# Patient Record
Sex: Female | Born: 1950 | Race: Black or African American | Hispanic: No | Marital: Single | Smoking: Never smoker
Health system: Southern US, Community
[De-identification: ages and names within clinical notes are randomized; demographics above are authoritative.]

## PROBLEM LIST (undated history)

## (undated) DIAGNOSIS — C801 Malignant (primary) neoplasm, unspecified: Secondary | ICD-10-CM

## (undated) DIAGNOSIS — I10 Essential (primary) hypertension: Secondary | ICD-10-CM

## (undated) HISTORY — PX: PORTACATH PLACEMENT: SHX2246

---

## 2001-10-21 ENCOUNTER — Encounter: Payer: Self-pay | Admitting: Emergency Medicine

## 2001-10-21 ENCOUNTER — Emergency Department (HOSPITAL_COMMUNITY): Admission: EM | Admit: 2001-10-21 | Discharge: 2001-10-21 | Payer: Self-pay | Admitting: Emergency Medicine

## 2003-12-05 ENCOUNTER — Emergency Department (HOSPITAL_COMMUNITY): Admission: EM | Admit: 2003-12-05 | Discharge: 2003-12-06 | Payer: Self-pay | Admitting: *Deleted

## 2004-12-21 ENCOUNTER — Emergency Department (HOSPITAL_COMMUNITY): Admission: EM | Admit: 2004-12-21 | Discharge: 2004-12-21 | Payer: Self-pay | Admitting: Emergency Medicine

## 2007-08-27 ENCOUNTER — Emergency Department (HOSPITAL_COMMUNITY): Admission: EM | Admit: 2007-08-27 | Discharge: 2007-08-27 | Payer: Self-pay | Admitting: Emergency Medicine

## 2009-06-26 ENCOUNTER — Emergency Department (HOSPITAL_COMMUNITY): Admission: EM | Admit: 2009-06-26 | Discharge: 2009-06-26 | Payer: Self-pay | Admitting: Emergency Medicine

## 2010-03-22 ENCOUNTER — Encounter (HOSPITAL_COMMUNITY): Admission: RE | Admit: 2010-03-22 | Discharge: 2010-03-22 | Payer: Self-pay | Admitting: Cardiology

## 2010-10-08 ENCOUNTER — Encounter: Payer: Self-pay | Admitting: Internal Medicine

## 2010-12-21 LAB — POCT I-STAT, CHEM 8
BUN: 15 mg/dL (ref 6–23)
Calcium, Ion: 1.16 mmol/L (ref 1.12–1.32)
Chloride: 106 mEq/L (ref 96–112)
Creatinine, Ser: 1 mg/dL (ref 0.4–1.2)
Glucose, Bld: 87 mg/dL (ref 70–99)
HCT: 34 % — ABNORMAL LOW (ref 36.0–46.0)
Hemoglobin: 11.6 g/dL — ABNORMAL LOW (ref 12.0–15.0)
Potassium: 3.6 mEq/L (ref 3.5–5.1)
Sodium: 140 mEq/L (ref 135–145)
TCO2: 26 mmol/L (ref 0–100)

## 2010-12-21 LAB — POCT CARDIAC MARKERS
CKMB, poc: <1 ng/mL — ABNORMAL LOW (ref 1.0–8.0)
Myoglobin, poc: 88.8 ng/mL (ref 12–200)
Troponin i, poc: <0.05 ng/mL (ref 0.00–0.09)

## 2011-06-25 LAB — URINALYSIS, ROUTINE W REFLEX MICROSCOPIC
Ketones, ur: NEGATIVE
Nitrite: NEGATIVE
Protein, ur: 100 — AB
Urobilinogen, UA: 0.2
pH: 7

## 2012-06-27 ENCOUNTER — Other Ambulatory Visit (HOSPITAL_COMMUNITY): Payer: Self-pay | Admitting: Internal Medicine

## 2012-06-27 DIAGNOSIS — Z139 Encounter for screening, unspecified: Secondary | ICD-10-CM

## 2012-07-03 ENCOUNTER — Inpatient Hospital Stay (HOSPITAL_COMMUNITY): Admission: RE | Admit: 2012-07-03 | Payer: Self-pay | Source: Ambulatory Visit

## 2012-07-22 ENCOUNTER — Telehealth: Payer: Self-pay

## 2012-07-22 NOTE — Telephone Encounter (Signed)
LMOM to call.

## 2012-07-29 NOTE — Telephone Encounter (Signed)
Called pt. She wants to check her schedule and call me back.

## 2012-08-01 NOTE — Telephone Encounter (Signed)
Letter to pt and PCP.  

## 2015-03-28 ENCOUNTER — Other Ambulatory Visit (HOSPITAL_COMMUNITY): Payer: Self-pay | Admitting: Internal Medicine

## 2015-03-28 DIAGNOSIS — Z1231 Encounter for screening mammogram for malignant neoplasm of breast: Secondary | ICD-10-CM

## 2015-04-11 ENCOUNTER — Ambulatory Visit (HOSPITAL_COMMUNITY)
Admission: RE | Admit: 2015-04-11 | Discharge: 2015-04-11 | Disposition: A | Discharge status: Home / Self Care | Payer: BC Managed Care – PPO | Source: Ambulatory Visit | Attending: Internal Medicine | Admitting: Internal Medicine

## 2015-04-11 DIAGNOSIS — Z1231 Encounter for screening mammogram for malignant neoplasm of breast: Secondary | ICD-10-CM | POA: Insufficient documentation

## 2015-04-29 ENCOUNTER — Encounter (HOSPITAL_COMMUNITY): Payer: Self-pay | Admitting: Emergency Medicine

## 2015-04-29 ENCOUNTER — Inpatient Hospital Stay (HOSPITAL_COMMUNITY)
Admission: EM | Admit: 2015-04-29 | Discharge: 2015-05-01 | DRG: 375 | Disposition: A | Discharge status: Home / Self Care | Payer: BC Managed Care – PPO | Attending: Internal Medicine | Admitting: Internal Medicine

## 2015-04-29 ENCOUNTER — Emergency Department (HOSPITAL_COMMUNITY): Payer: BC Managed Care – PPO

## 2015-04-29 DIAGNOSIS — R740 Nonspecific elevation of levels of transaminase and lactic acid dehydrogenase [LDH]: Secondary | ICD-10-CM

## 2015-04-29 DIAGNOSIS — I10 Essential (primary) hypertension: Secondary | ICD-10-CM | POA: Diagnosis present

## 2015-04-29 DIAGNOSIS — Z7982 Long term (current) use of aspirin: Secondary | ICD-10-CM | POA: Diagnosis not present

## 2015-04-29 DIAGNOSIS — R1084 Generalized abdominal pain: Secondary | ICD-10-CM

## 2015-04-29 DIAGNOSIS — C189 Malignant neoplasm of colon, unspecified: Principal | ICD-10-CM | POA: Diagnosis present

## 2015-04-29 DIAGNOSIS — R195 Other fecal abnormalities: Secondary | ICD-10-CM | POA: Diagnosis not present

## 2015-04-29 DIAGNOSIS — R7401 Elevation of levels of liver transaminase levels: Secondary | ICD-10-CM

## 2015-04-29 DIAGNOSIS — K625 Hemorrhage of anus and rectum: Secondary | ICD-10-CM

## 2015-04-29 DIAGNOSIS — C787 Secondary malignant neoplasm of liver and intrahepatic bile duct: Secondary | ICD-10-CM | POA: Diagnosis present

## 2015-04-29 DIAGNOSIS — K5660 Unspecified intestinal obstruction: Secondary | ICD-10-CM | POA: Diagnosis present

## 2015-04-29 DIAGNOSIS — R52 Pain, unspecified: Secondary | ICD-10-CM

## 2015-04-29 DIAGNOSIS — R112 Nausea with vomiting, unspecified: Secondary | ICD-10-CM | POA: Diagnosis present

## 2015-04-29 DIAGNOSIS — K6389 Other specified diseases of intestine: Secondary | ICD-10-CM

## 2015-04-29 DIAGNOSIS — R100 Acute abdomen: Secondary | ICD-10-CM | POA: Diagnosis not present

## 2015-04-29 DIAGNOSIS — R1110 Vomiting, unspecified: Secondary | ICD-10-CM

## 2015-04-29 DIAGNOSIS — K5900 Constipation, unspecified: Secondary | ICD-10-CM | POA: Diagnosis not present

## 2015-04-29 DIAGNOSIS — R109 Unspecified abdominal pain: Secondary | ICD-10-CM | POA: Diagnosis present

## 2015-04-29 DIAGNOSIS — R10811 Right upper quadrant abdominal tenderness: Secondary | ICD-10-CM | POA: Diagnosis present

## 2015-04-29 DIAGNOSIS — C801 Malignant (primary) neoplasm, unspecified: Secondary | ICD-10-CM | POA: Diagnosis not present

## 2015-04-29 HISTORY — DX: Essential (primary) hypertension: I10

## 2015-04-29 LAB — BASIC METABOLIC PANEL
Anion gap: 11 (ref 5–15)
BUN: 18 mg/dL (ref 6–20)
CHLORIDE: 100 mmol/L — AB (ref 101–111)
CO2: 24 mmol/L (ref 22–32)
CREATININE: 1.13 mg/dL — AB (ref 0.44–1.00)
Calcium: 9.2 mg/dL (ref 8.9–10.3)
GFR calc non Af Amer: 50 mL/min — ABNORMAL LOW (ref >60)
GFR, EST AFRICAN AMERICAN: 58 mL/min — AB (ref >60)
GLUCOSE: 123 mg/dL — AB (ref 65–99)
POTASSIUM: 3.8 mmol/L (ref 3.5–5.1)
Sodium: 135 mmol/L (ref 135–145)

## 2015-04-29 LAB — HEPATIC FUNCTION PANEL
ALT: 106 U/L — AB (ref 14–54)
AST: 69 U/L — ABNORMAL HIGH (ref 15–41)
Albumin: 3.5 g/dL (ref 3.5–5.0)
Alkaline Phosphatase: 356 U/L — ABNORMAL HIGH (ref 38–126)
Bilirubin, Direct: 0.2 mg/dL (ref 0.1–0.5)
Indirect Bilirubin: 0.5 mg/dL (ref 0.3–0.9)
TOTAL PROTEIN: 7.4 g/dL (ref 6.5–8.1)
Total Bilirubin: 0.7 mg/dL (ref 0.3–1.2)

## 2015-04-29 LAB — CBC
HEMATOCRIT: 30.4 % — AB (ref 36.0–46.0)
Hemoglobin: 9.9 g/dL — ABNORMAL LOW (ref 12.0–15.0)
MCH: 28.7 pg (ref 26.0–34.0)
MCHC: 32.6 g/dL (ref 30.0–36.0)
MCV: 88.1 fL (ref 78.0–100.0)
Platelets: 285 10*3/uL (ref 150–400)
RBC: 3.45 MIL/uL — AB (ref 3.87–5.11)
RDW: 14.3 % (ref 11.5–15.5)
WBC: 9.6 10*3/uL (ref 4.0–10.5)

## 2015-04-29 LAB — TROPONIN I

## 2015-04-29 LAB — LIPASE, BLOOD: Lipase: 32 U/L (ref 22–51)

## 2015-04-29 LAB — I-STAT CG4 LACTIC ACID, ED: Lactic Acid, Venous: 0.62 mmol/L (ref 0.5–2.0)

## 2015-04-29 LAB — POC OCCULT BLOOD, ED: FECAL OCCULT BLD: POSITIVE — AB

## 2015-04-29 MED ORDER — ONDANSETRON HCL 4 MG/2ML IJ SOLN
INTRAMUSCULAR | Status: AC
  Filled 2015-04-29: qty 2

## 2015-04-29 MED ORDER — ONDANSETRON HCL 4 MG/2ML IJ SOLN
4.0000 mg | Freq: Once | INTRAMUSCULAR | Status: DC
  Filled 2015-04-29: qty 2

## 2015-04-29 MED ORDER — ONDANSETRON HCL 4 MG/2ML IJ SOLN
4.0000 mg | Freq: Once | INTRAMUSCULAR | Status: AC
  Administered 2015-04-29: 4 mg via INTRAVENOUS

## 2015-04-29 MED ORDER — SODIUM CHLORIDE 0.9 % IV BOLUS (SEPSIS)
500.0000 mL | Freq: Once | INTRAVENOUS | Status: AC
  Administered 2015-04-29: 500 mL via INTRAVENOUS

## 2015-04-29 MED ORDER — SODIUM CHLORIDE 0.9 % IV SOLN
INTRAVENOUS | Status: DC
  Administered 2015-04-30: via INTRAVENOUS

## 2015-04-29 NOTE — ED Provider Notes (Signed)
CSN: [id number]     Arrival date & time 04/29/15  2049 History  This chart was scribed for Daleen Bo, MD by Irene Pap, ED Scribe. This patient was seen in room APA18/APA18 and patient care was started at 9:18 PM.    Chief Complaint  Patient presents with  . Chest Pain   The history is provided by the patient. No language interpreter was used.  HPI Comments: Katie Moss is a 64 y.o. Female with a hx of HTN who presents to the Emergency Department complaining of waxing and waning chest pain onset earlier today. Pt states that the pain first started in her abdomen for two days and radiated up to her chest. She currently rates the pain 8/10. Reports associated bright red hematochezia, 1 episode at 6 PM, nausea that started today, weakness with standing, non-productive cough, and constipation; states that she was straining today when she saw the blood. Pt states that she has not eaten today and experiences nausea with drinking water. She states that she was seen by her PCP two weeks ago and had a recent change in medication for her HTN, a generic form of Diovan. She states that her kidneys have been acting up and is being monitored by her PCP, believes this may be contributing to her symptoms. Pt denies fever, vomiting, SOB, or diarrhea.   Past Medical History  Diagnosis Date  . Hypertension    History reviewed. No pertinent past surgical history. History reviewed. No pertinent family history. Social History  Substance Use Topics  . Smoking status: Never Smoker   . Smokeless tobacco: None  . Alcohol Use: No   OB History    No data available     Review of Systems  Constitutional: Positive for appetite change. Negative for fever.  Respiratory: Positive for cough. Negative for shortness of breath.   Cardiovascular: Positive for chest pain.  Gastrointestinal: Positive for nausea, abdominal pain, constipation and blood in stool. Negative for vomiting and diarrhea.   Neurological: Positive for weakness.  All other systems reviewed and are negative.  Allergies  Review of patient's allergies indicates no known allergies.  Home Medications   Prior to Admission medications   Medication Sig Start Date End Date Taking? Authorizing Provider  aspirin EC 81 MG tablet Take 81 mg by mouth daily.   Yes Historical Provider, MD  atorvastatin (LIPITOR) 20 MG tablet Take 20 mg by mouth daily.   Yes Historical Provider, MD  valsartan (DIOVAN) 320 MG tablet Take 320 mg by mouth daily.   Yes Historical Provider, MD  VITAMIN E COMPLEX PO Take 1 capsule by mouth daily.   Yes Historical Provider, MD   BP 165/86 mmHg  Pulse 82  Temp(Src) 98.8 F (37.1 C) (Oral)  Resp 21  Ht 5\' 7"  (1.702 m)  Wt 173 lb (78.472 kg)  BMI 27.09 kg/m2  SpO2 98%  Physical Exam  Constitutional: She is oriented to person, place, and time. She appears well-developed and well-nourished.  HENT:  Head: Normocephalic and atraumatic.  Eyes: Conjunctivae and EOM are normal. Pupils are equal, round, and reactive to light.  Neck: Normal range of motion and phonation normal. Neck supple.  Cardiovascular: Normal rate and regular rhythm.   Pulmonary/Chest: Effort normal and breath sounds normal. She exhibits no tenderness.  Abdominal: Soft. She exhibits no distension. There is tenderness. There is no guarding.  Diffuse abdominal tenderness, worse in the RUQ and LUQ  Genitourinary:  Anus; has external skin tags. Posterior aspect of  2 of the skin tags have superficial fissuring, consistent with recent site of visualized red blood. Stool is black and slightly tarry. Stool guaiac is positive. No fecal impaction or mass.  Musculoskeletal: Normal range of motion.  Neurological: She is alert and oriented to person, place, and time. She exhibits normal muscle tone.  Skin: Skin is warm and dry.  Psychiatric: She has a normal mood and affect. Her behavior is normal. Judgment and thought content normal.   Nursing note and vitals reviewed.   ED Course  Procedures (including critical care time) DIAGNOSTIC STUDIES: Oxygen Saturation is 98% on RA, normal by my interpretation.    COORDINATION OF CARE: 9:23 PM-Discussed treatment plan which includes rectal exam and labs with pt at bedside and pt agreed to plan.    Patient Vitals for the past 24 hrs:  BP Temp Temp src Pulse Resp SpO2 Height Weight  04/29/15 2217 152/86 mmHg - - 69 24 100 % - -  04/29/15 2130 150/87 mmHg - - - - - - -  04/29/15 2115 - - - 71 20 98 % - -  04/29/15 2100 165/86 mmHg 98.8 F (37.1 C) Oral 82 21 98 % 5\' 7"  (1.702 m) 173 lb (78.472 kg)    10:42 PM Reevaluation with update and discussion. After initial assessment and treatment, an updated evaluation reveals pt exam is unchanged. Brinton Brandel L   11:30 PM-Consult complete with Dr. Shanon Brow. Patient case explained and discussed. She agrees to admit patient for further evaluation and treatment. Call ended at [place] - Abnormal; Notable for the following:    Chloride 100 (*)    Glucose, Bld 123 (*)    Creatinine, Ser 1.13 (*)    GFR calc non Af Amer 50 (*)    GFR calc Af Amer 58 (*)    All other components within normal limits  CBC - Abnormal; Notable for the following:    RBC 3.45 (*)    Hemoglobin 9.9 (*)    HCT 30.4 (*)    All other components within normal limits  HEPATIC FUNCTION PANEL - Abnormal; Notable for the following:    AST 69 (*)    ALT 106 (*)    Alkaline Phosphatase 356 (*)    All other components within normal limits  POC OCCULT BLOOD, ED - Abnormal; Notable for the following:    Fecal Occult Bld POSITIVE (*)    All other components within normal limits  TROPONIN I  LIPASE, BLOOD  I-STAT CG4 LACTIC ACID, ED  I-STAT CG4 LACTIC ACID, ED   Filed Vitals:   04/29/15 2217  BP: 152/86  Pulse: 69  Temp:   Resp: 24   Medications  0.9 %  sodium chloride infusion (not administered)   ondansetron (ZOFRAN) injection 4 mg ( Intravenous Not Given 04/29/15 2145)  sodium chloride 0.9 % bolus 500 mL (500 mLs Intravenous New Bag/Given 04/29/15 2302)    Imaging Review Dg Chest 2 View  04/29/2015   CLINICAL DATA:  Intermittent chest pain and nausea.  EXAM: CHEST - 2 VIEW  COMPARISON:  Stable the one-view chest radiograph 12/05/2003.  FINDINGS: Heart size is normal. Lungs are clear. Mild rightward curvature is present in the upper thoracic spine. There is no edema or effusion to suggest failure. Degenerative changes are present in the shoulders, right greater than left. There is widening of the [facility] joint bilaterally.  IMPRESSION: 1. No acute cardiopulmonary disease. 2. Scoliosis of the  upper thoracic spine. 3. Degenerative changes in the shoulders.   Electronically Signed   By: San Morelle M.D.   On: 04/29/2015 21:56   Dg Abd 2 Views  04/29/2015   CLINICAL DATA:  Mid abdominal pain x2 days, chest pain, nausea, weakness  EXAM: ABDOMEN - 2 VIEW  COMPARISON:  None.  FINDINGS: Nonobstructive bowel gas pattern.  No evidence of free air under the diaphragm on the upright view.  Moderate right colonic stool burden.  IMPRESSION: No evidence of small bowel obstruction or free air.  Moderate right colonic stool burden.   Electronically Signed   By: Julian Hy M.D.   On: 04/29/2015 21:56      EKG Interpretation   Date/Time:  Friday April 29 2015 22:42:17 EDT Ventricular Rate:  69 PR Interval:  184 QRS Duration: 92 QT Interval:  406 QTC Calculation: 435 R Axis:   46 Text Interpretation:  Sinus rhythm Consider left atrial enlargement Since  last tracing of earlier today No significant change was found Confirmed by  Eulis Foster  MD, Kamil Mchaffie [phone]) on 04/29/2015 10:46:18 PM        EKG Interpretation  Date/Time:  Friday April 29 2015 22:42:17 EDT Ventricular Rate:  69 PR Interval:  184 QRS Duration: 92 QT Interval:  406 QTC Calculation: 435 R Axis:   46 Text  Interpretation:  Sinus rhythm Consider left atrial enlargement Since last tracing of earlier today No significant change was found Confirmed by Eulis Foster  MD, Aerilyn Slee ([id number]) on 04/29/2015 10:46:18 PM         MDM   Final diagnoses:  Vomiting  Generalized abdominal pain  Rectal bleeding  Constipation, unspecified constipation type  Transaminitis   Nonspecific abdominal pain with vomiting and chest discomfort. Patient with ongoing constipation, and rectal bleeding after straining to have a bowel movement, tonight. No fecal impaction, or bowel obstruction. Moderate colonic stool burden. Doubt upper GI bleed. Stool dark in color, indicating higher source of bleeding. Lactate is normal, making bowel ischemia less likely. Possible small bowel bleeding. Nonspecific transaminitis with normal bilirubin. Doubt pancreatitis, ACS, serious bacterial infection or metabolic instability.  Nursing Notes Reviewed/ Care Coordinated, and agree without changes. Applicable Imaging Reviewed.  Interpretation of Laboratory Data incorporated into ED treatment  Plan: Admit   I personally performed the services described in this documentation, which was scribed in my presence. The recorded information has been reviewed and is accurate.      Daleen Bo, MD 04/29/15 [phone]

## 2015-04-29 NOTE — ED Notes (Addendum)
Patient complaining of chest pain off and on since awakening this morning. States it started as abdominal pain and radiates into chest pain. Patient vomiting at triage.

## 2015-04-30 ENCOUNTER — Inpatient Hospital Stay (HOSPITAL_COMMUNITY): Payer: BC Managed Care – PPO

## 2015-04-30 DIAGNOSIS — K5900 Constipation, unspecified: Secondary | ICD-10-CM | POA: Insufficient documentation

## 2015-04-30 DIAGNOSIS — R112 Nausea with vomiting, unspecified: Secondary | ICD-10-CM

## 2015-04-30 DIAGNOSIS — R100 Acute abdomen: Secondary | ICD-10-CM

## 2015-04-30 DIAGNOSIS — R195 Other fecal abnormalities: Secondary | ICD-10-CM

## 2015-04-30 LAB — COMPREHENSIVE METABOLIC PANEL
ALBUMIN: 3.3 g/dL — AB (ref 3.5–5.0)
ALT: 95 U/L — AB (ref 14–54)
AST: 59 U/L — AB (ref 15–41)
Alkaline Phosphatase: 342 U/L — ABNORMAL HIGH (ref 38–126)
Anion gap: 10 (ref 5–15)
BILIRUBIN TOTAL: 0.6 mg/dL (ref 0.3–1.2)
BUN: 17 mg/dL (ref 6–20)
CHLORIDE: 104 mmol/L (ref 101–111)
CO2: 23 mmol/L (ref 22–32)
Calcium: 9 mg/dL (ref 8.9–10.3)
Creatinine, Ser: 1.11 mg/dL — ABNORMAL HIGH (ref 0.44–1.00)
GFR calc non Af Amer: 51 mL/min — ABNORMAL LOW (ref >60)
GFR, EST AFRICAN AMERICAN: 59 mL/min — AB (ref >60)
GLUCOSE: 128 mg/dL — AB (ref 65–99)
Potassium: 3.5 mmol/L (ref 3.5–5.1)
Sodium: 137 mmol/L (ref 135–145)
TOTAL PROTEIN: 7.3 g/dL (ref 6.5–8.1)

## 2015-04-30 LAB — CBC
HCT: 31.2 % — ABNORMAL LOW (ref 36.0–46.0)
Hemoglobin: 10.1 g/dL — ABNORMAL LOW (ref 12.0–15.0)
MCH: 28.5 pg (ref 26.0–34.0)
MCHC: 32.4 g/dL (ref 30.0–36.0)
MCV: 88.1 fL (ref 78.0–100.0)
PLATELETS: 353 10*3/uL (ref 150–400)
RBC: 3.54 MIL/uL — AB (ref 3.87–5.11)
RDW: 14.2 % (ref 11.5–15.5)
WBC: 10.4 10*3/uL (ref 4.0–10.5)

## 2015-04-30 MED ORDER — POLYETHYLENE GLYCOL 3350 17 G PO PACK
17.0000 g | PACK | Freq: Every day | ORAL | Status: DC
  Administered 2015-04-30: 17 g via ORAL
  Filled 2015-04-30 (×2): qty 1

## 2015-04-30 MED ORDER — HYDROMORPHONE HCL 1 MG/ML IJ SOLN: 1.0000 mg | INTRAMUSCULAR | Status: DC | PRN

## 2015-04-30 MED ORDER — SODIUM CHLORIDE 0.9 % IJ SOLN
INTRAMUSCULAR | Status: AC
  Filled 2015-04-30: qty 500

## 2015-04-30 MED ORDER — IOHEXOL 300 MG/ML  SOLN
50.0000 mL | Freq: Once | INTRAMUSCULAR | Status: AC | PRN
Start: 2015-04-30 — End: 2015-04-30
  Administered 2015-04-30: 50 mL via ORAL

## 2015-04-30 MED ORDER — PROMETHAZINE HCL 25 MG/ML IJ SOLN
12.5000 mg | Freq: Four times a day (QID) | INTRAMUSCULAR | Status: DC | PRN
  Administered 2015-04-30: 12.5 mg via INTRAVENOUS
  Filled 2015-04-30: qty 1

## 2015-04-30 MED ORDER — IRBESARTAN 75 MG PO TABS
37.5000 mg | ORAL_TABLET | Freq: Every day | ORAL | Status: DC
  Filled 2015-04-30 (×2): qty 1

## 2015-04-30 MED ORDER — ONDANSETRON HCL 4 MG/2ML IJ SOLN
4.0000 mg | Freq: Four times a day (QID) | INTRAMUSCULAR | Status: DC | PRN
  Administered 2015-04-30 – 2015-05-01 (×3): 4 mg via INTRAVENOUS
  Filled 2015-04-30 (×3): qty 2

## 2015-04-30 MED ORDER — SODIUM CHLORIDE 0.9 % IJ SOLN
INTRAMUSCULAR | Status: AC
  Filled 2015-04-30: qty 30

## 2015-04-30 MED ORDER — BISACODYL 10 MG RE SUPP: 10.0000 mg | Freq: Every day | RECTAL | Status: DC | PRN

## 2015-04-30 MED ORDER — SODIUM CHLORIDE 0.9 % IV SOLN
INTRAVENOUS | Status: DC
  Administered 2015-04-30: 21:00:00 via INTRAVENOUS

## 2015-04-30 MED ORDER — ALUM & MAG HYDROXIDE-SIMETH 200-200-20 MG/5ML PO SUSP: 30.0000 mL | Freq: Four times a day (QID) | ORAL | Status: DC | PRN

## 2015-04-30 MED ORDER — ASPIRIN EC 81 MG PO TBEC
81.0000 mg | DELAYED_RELEASE_TABLET | Freq: Every day | ORAL | Status: DC
  Administered 2015-04-30: 81 mg via ORAL
  Filled 2015-04-30 (×2): qty 1

## 2015-04-30 MED ORDER — ATORVASTATIN CALCIUM 20 MG PO TABS
20.0000 mg | ORAL_TABLET | Freq: Every day | ORAL | Status: DC
  Administered 2015-04-30: 20 mg via ORAL
  Filled 2015-04-30 (×2): qty 1

## 2015-04-30 MED ORDER — PROMETHAZINE HCL 12.5 MG PO TABS: 12.5000 mg | ORAL_TABLET | Freq: Four times a day (QID) | ORAL | Status: DC | PRN

## 2015-04-30 MED ORDER — SODIUM CHLORIDE 0.9 % IV SOLN
INTRAVENOUS | Status: DC
  Administered 2015-04-30 (×2): via INTRAVENOUS

## 2015-04-30 MED ORDER — SODIUM CHLORIDE 0.9 % IV SOLN
INTRAVENOUS | Status: DC
Start: 2015-04-30 — End: 2015-04-30

## 2015-04-30 MED ORDER — IOHEXOL 300 MG/ML  SOLN
100.0000 mL | Freq: Once | INTRAMUSCULAR | Status: AC | PRN
Start: 2015-04-30 — End: 2015-04-30
  Administered 2015-04-30: 100 mL via INTRAVENOUS

## 2015-04-30 NOTE — Progress Notes (Addendum)
Called to bedside given abnormal CT abd results. Please see CT findings. Essentially, findings are worrisome for metastatic colon cancer. Pt seen at bedside and informed of abnormal results. Pt actively vomiting. Will make NPO with basal IVF. Have discussed case with General Surgery who will see in consultation. Have also ordered CEA per Surgery recs. Will notify primary day team.

## 2015-04-30 NOTE — H&P (Signed)
PCP:   Glo Herring., MD   Chief Complaint:  Abdominal pain  HPI: 64 yo female comes in with nonbloody vomit and generalized abdominal pain for a day.  Denies fevers.  She says she has been very constipated lately and strained today and had some blood in her BM.  Very poor historian.  She has never been this constipated before.  On rectal exam with ED had melena and heme positive.  Referred for admission for her heme pos stool and constipation.  Review of Systems:  Positive and negative as per HPI otherwise all other systems are negative  Past Medical History: Past Medical History  Diagnosis Date  . Hypertension    History reviewed. No pertinent past surgical history.  Medications: Prior to Admission medications   Medication Sig Start Date End Date Taking? Authorizing Provider  aspirin EC 81 MG tablet Take 81 mg by mouth daily.   Yes Historical Provider, MD  atorvastatin (LIPITOR) 20 MG tablet Take 20 mg by mouth daily.   Yes Historical Provider, MD  valsartan (DIOVAN) 320 MG tablet Take 320 mg by mouth daily.   Yes Historical Provider, MD  VITAMIN E COMPLEX PO Take 1 capsule by mouth daily.   Yes Historical Provider, MD    Allergies:  No Known Allergies  Social History:  reports that she has never smoked. She does not have any smokeless tobacco history on file. She reports that she does not drink alcohol or use illicit drugs.  Family History: History reviewed. No pertinent family history.  Physical Exam: Filed Vitals:   04/29/15 2230 04/29/15 2245 04/29/15 2300 04/30/15 0057  BP: 152/84  146/85 151/82  Pulse:  74  74  Temp:    99 F (37.2 C)  TempSrc:      Resp:  _0 Height:    _1  (1.702 m)  Weight:    78.2 kg (172 lb 6.4 oz)  SpO2:  99%     General appearance: alert, cooperative and no distress Head: Normocephalic, without obvious abnormality, atraumatic Eyes: negative Nose: Nares normal. Septum midline. Mucosa normal. No drainage or sinus  tenderness. Neck: no JVD and supple, symmetrical, trachea midline Lungs: clear to auscultation bilaterally Heart: regular rate and rhythm, S1, S2 normal, no murmur, click, rub or gallop Abdomen: soft, non-tender; bowel sounds normal; no masses,  no organomegaly Extremities: extremities normal, atraumatic, no cyanosis or edema Pulses: 2+ and symmetric Skin: Skin color, texture, turgor normal. No rashes or lesions Neurologic: Grossly normal    Labs on Admission:   Recent Labs  04/29/15 2105  NA 135  K 3.8  CL 100*  CO2 24  GLUCOSE 123*  BUN 18  CREATININE 1.13*  CALCIUM 9.2    Recent Labs  04/29/15 2105  AST 69*  ALT 106*  ALKPHOS 356*  BILITOT 0.7  PROT 7.4  ALBUMIN 3.5    Recent Labs  04/29/15 2105  LIPASE 32    Recent Labs  04/29/15 2105  WBC 9.6  HGB 9.9*  HCT 30.4*  MCV 88.1  PLT 285    Recent Labs  04/29/15 2105  TROPONINI <0.03   Radiological Exams on Admission: Dg Chest 2 View  04/29/2015   CLINICAL DATA:  Intermittent chest pain and nausea.  EXAM: CHEST - 2 VIEW  COMPARISON:  Stable the one-view chest radiograph 12/05/2003.  FINDINGS: Heart size is normal. Lungs are clear. Mild rightward curvature is present in the upper thoracic spine. There is no edema or effusion  to suggest failure. Degenerative changes are present in the shoulders, right greater than left. There is widening of the [facility] joint bilaterally.  IMPRESSION: 1. No acute cardiopulmonary disease. 2. Scoliosis of the upper thoracic spine. 3. Degenerative changes in the shoulders.   Electronically Signed   By: San Morelle M.D.   On: 04/29/2015 21:56   Dg Abd 2 Views  04/29/2015   CLINICAL DATA:  Mid abdominal pain x2 days, chest pain, nausea, weakness  EXAM: ABDOMEN - 2 VIEW  COMPARISON:  None.  FINDINGS: Nonobstructive bowel gas pattern.  No evidence of free air under the diaphragm on the upright view.  Moderate right colonic stool burden.  IMPRESSION: No evidence of small bowel  obstruction or free air.  Moderate right colonic stool burden.   Electronically Signed   By: Julian Hy M.D.   On: 04/29/2015 21:56   Assessment/Plan  64 yo female with generalized abdominal pain  Principal Problem:   Abdominal pain, acute-  Hard to tell history from patient, lfts are elevated along with alk phos.  Will check abdominal US.  Treat constipation and monitor.  Ivf.  Zofran.    Pt has never had scoping so will need GI follow up as some point.  abd exam is benign.  Active Problems:   Constipation-  suppository   Heme positive stool-  Repeat h/h in am, will need c scope at some point has had no screening   Nausea & vomiting-  Resolved at this time   Hypertension-  noted  obs on medical.    DAVID,RACHAL A 04/30/2015, 2:20 AM

## 2015-04-30 NOTE — Progress Notes (Signed)
[facility] PROGRESS NOTE  Katie Moss ENI:[id number] DOB: [birth year] DOA: 04/29/2015 PCP: Glo Herring., MD  Assessment/Plan:  Acute abdominal pain -Suspect related to constipation. -DG Abd unremarkable -Abd [place]: IMPRESSION: 1. Combination of coarse liver echotexture and evidence of hepatic few will flow in the main portal vein suggest chronic hepatocellular disease. There are 2 large and septated but otherwise benign appearing cysts in the liver, 6.9 and 7.6 cm. 2. Abnormal soft tissue near the head of the pancreas and porta hepatis suspicious for upper abdominal lymphadenopathy, up to 5.4 cm. 3. Recommend followup CT Abdomen and Pelvis with oral and IV Contrast. -Will order CT scan for follow up as recommended by radiology.  Constipation -Start miralax. -abd xray with significant stool burden  Heme positive stool -Precipitated by constipation and straining with likely an anal fissure. -Bowel regimen ordered. -Will need f/u with GI as an OP for colonoscopy.  Nausea and Vomiting -Resolved with IVF and Zofran. Advance diet as tolerated.   Hypertension -Well controlled.   Transamnitits  -Follow up with PCP to discuss possible side effects of Lipitor.    Code Status: Full DVT prophylaxis:  SCDs Family Communication: Cousin at bedside, discussed care plan with both. They had no concerns at this time.  Disposition Plan: Discharge home in 24 hours  Consultants:  none  Procedures:  none  Antibiotics:  none   HPI/Subjective: Has been constipated for 3 days and last night noticed blood in stool. Abdominal pain and nausea is improving. No complaints of vomiting, chest pain, or headache.   Objective: Filed Vitals:   04/30/15 0647  BP: 135/73  Pulse: 65  Temp: 98.5 F (36.9 C)  Resp: 20   No intake or output data in the 24 hours ending 04/30/15 0657 Filed Weights   04/29/15 2100 04/30/15 0057 04/30/15 0500  Weight: 78.472 kg (173 lb) 78.2  kg (172 lb 6.4 oz) 78.2 kg (172 lb 6.4 oz)    Exam: General:  NAD, appears calm and comfortable, lying in bed, afebrile Cardiovascular: RRR, no m/r/g Respiratory: CTAB, no w/r/r Abdomen: soft, positive bowel sounds, no distension Extremities: no LE edema Neurologic:  Non focal  Data Reviewed: Basic Metabolic Panel:  Recent Labs Lab 04/29/15 2105  NA 135  K 3.8  CL 100*  CO2 24  GLUCOSE 123*  BUN 18  CREATININE 1.13*  CALCIUM 9.2   Liver Function Tests:  Recent Labs Lab 04/29/15 2105  AST 69*  ALT 106*  ALKPHOS 356*  BILITOT 0.7  PROT 7.4  ALBUMIN 3.5    Recent Labs Lab 04/29/15 2105  LIPASE 32   CBC:  Recent Labs Lab 04/29/15 2105 04/30/15 0542  WBC 9.6 10.4  HGB 9.9* 10.1*  HCT 30.4* 31.2*  MCV 88.1 88.1  PLT 285 353   Cardiac Enzymes:  Recent Labs Lab 04/29/15 2105  TROPONINI <0.03    Studies: Dg Chest 2 View  04/29/2015   CLINICAL DATA:  Intermittent chest pain and nausea.  EXAM: CHEST - 2 VIEW  COMPARISON:  Stable the one-view chest radiograph 12/05/2003.  FINDINGS: Heart size is normal. Lungs are clear. Mild rightward curvature is present in the upper thoracic spine. There is no edema or effusion to suggest failure. Degenerative changes are present in the shoulders, right greater than left. There is widening of the [facility] joint bilaterally.  IMPRESSION: 1. No acute cardiopulmonary disease. 2. Scoliosis of the upper thoracic spine. 3. Degenerative changes in the shoulders.   Electronically Signed   By: Harrell Gave  Mattern M.D.   On: 04/29/2015 21:56   Dg Abd 2 Views  04/29/2015   CLINICAL DATA:  Mid abdominal pain x2 days, chest pain, nausea, weakness  EXAM: ABDOMEN - 2 VIEW  COMPARISON:  None.  FINDINGS: Nonobstructive bowel gas pattern.  No evidence of free air under the diaphragm on the upright view.  Moderate right colonic stool burden.  IMPRESSION: No evidence of small bowel obstruction or free air.  Moderate right colonic stool burden.    Electronically Signed   By: Julian Hy M.D.   On: 04/29/2015 21:56    Scheduled Meds: . sodium chloride   Intravenous STAT  . aspirin EC  81 mg Oral Daily  . atorvastatin  20 mg Oral Daily   Continuous Infusions: . sodium chloride 75 mL/hr at 04/30/15 0346    Principal Problem:   Abdominal pain, acute Active Problems:   Constipation   Heme positive stool   Nausea & vomiting   Hypertension    Time spent: 35 minutes. Greater than 50% of this time was spent in direct contact with the patient coordinating care.   Domingo Mend, MD  [facility] Pager [phone]. If 7PM-7AM, please contact night-coverage at [email or web address], password [facility] 04/30/2015, 6:57 AM  LOS: 1 day     I, Jessica D. Leonie Green, acting as scribe, recorded this note contemporaneously in the presence of Dr. Lelon Frohlich, M.D. on 04/30/2015.     I have reviewed the above documentation for accuracy and completeness, and I agree with the above.  Domingo Mend, MD [facility] Pager: [phone]

## 2015-05-01 DIAGNOSIS — C787 Secondary malignant neoplasm of liver and intrahepatic bile duct: Secondary | ICD-10-CM

## 2015-05-01 DIAGNOSIS — K6389 Other specified diseases of intestine: Secondary | ICD-10-CM

## 2015-05-01 DIAGNOSIS — C801 Malignant (primary) neoplasm, unspecified: Secondary | ICD-10-CM

## 2015-05-01 MED ORDER — ONDANSETRON 8 MG PO TBDP: 8.0000 mg | ORAL_TABLET | Freq: Three times a day (TID) | ORAL | Status: DC | PRN

## 2015-05-01 NOTE — Progress Notes (Signed)
Patient discharged home today.  Patient was given discharge instructions.  Patient verbalized understanding with no complaints or concerns voiced at this time.  Patient's heart monitor was removed and central tele was notified of patient's discharge.  IV was removed with catheter intact, no bleeding or complications.  Patient left unit in stable condition by a staff member in a wheelchair.

## 2015-05-01 NOTE — Discharge Summary (Signed)
Physician Discharge Summary  Katie Moss TIR:[id number] DOB: 1950/11/15 DOA: 04/29/2015  PCP: Glo Herring., MD  Admit date: 04/29/2015 Discharge date: 05/01/2015  Time spent: 45 minutes  Recommendations for Outpatient Follow-up:  -Patient will be discharged today at her and her family's insistence. They believe that she would be better cared for at [facility] and plan on driving her there directly today after discharge. -Have advised patient and family that she needs medical care at present and have encouraged them to take her directly to the medical facility of their choice. -Appears to have a new diagnosis of metastatic colon cancer and will need follow-up with GI, surgery and subsequently oncology.    Discharge Diagnoses:  Principal Problem:   Abdominal pain, acute Active Problems:   Constipation   Heme positive stool   Nausea & vomiting   Hypertension   Colonic mass   Hepatic metastases   Discharge Condition: Stable, fair  Filed Weights   04/30/15 0057 04/30/15 0500 05/01/15 0543  Weight: 78.2 kg (172 lb 6.4 oz) 78.2 kg (172 lb 6.4 oz) 78.5 kg (173 lb 1 oz)    History of present illness:  As per H and P by Dr. Shanon Brow on 04/30/15: 64 yo female comes in with nonbloody vomit and generalized abdominal pain for a day. Denies fevers. She says she has been very constipated lately and strained today and had some blood in her BM. Very poor historian. She has never been this constipated before. On rectal exam with ED had melena and heme positive. Referred for admission for her heme pos stool and constipation.   Hospital Course:   Colonic mass with hepatic metastases -This is likely the etiology of her nausea, constipation and abdominal pain. -Have advised that she will need colonoscopy for biopsy purposes and possibly immediate surgery for what appears to be an obstructing colonic mass in the descending colon with distention of the right and  transverse colon, beyond the lesion the colon is small caliber and collapsed. -She does not appear to have complete colonic obstruction as she continues to pass flatus and has had a bowel movement. She is tolerating clear liquids without issue. -I had requested GI and surgery to see patient today, however as I come into the room to discuss CT scan findings and plan with patient and her family, they are adamant that they want her discharged so that they can pursue care at their hospital of choice which is [facility].  Apparently they have had a family member treated there in the past for colon cancer and have complete trust in their care.  Transaminitis - in light of CT scan findings, hepatic metastases are likely the etiology. However, never committed she stop use of Lipitor at this point.   Procedures:  None  Consultations:  None  Discharge Instructions      Discharge Instructions    Increase activity slowly    Complete by:  As directed             Medication List    STOP taking these medications        atorvastatin 20 MG tablet  Commonly known as:  LIPITOR      TAKE these medications        aspirin EC 81 MG tablet  Take 81 mg by mouth daily.     valsartan 320 MG tablet  Commonly known as:  DIOVAN  Take 320 mg by mouth daily.     VITAMIN E  COMPLEX PO  Take 1 capsule by mouth daily.       No Known Allergies    The results of significant diagnostics from this hospitalization (including imaging, microbiology, ancillary and laboratory) are listed below for reference.    Significant Diagnostic Studies: Dg Chest 2 View  04/29/2015   CLINICAL DATA:  Intermittent chest pain and nausea.  EXAM: CHEST - 2 VIEW  COMPARISON:  Stable the one-view chest radiograph 12/05/2003.  FINDINGS: Heart size is normal. Lungs are clear. Mild rightward curvature is present in the upper thoracic spine. There is no edema or effusion to suggest failure. Degenerative changes are present  in the shoulders, right greater than left. There is widening of the [facility] joint bilaterally.  IMPRESSION: 1. No acute cardiopulmonary disease. 2. Scoliosis of the upper thoracic spine. 3. Degenerative changes in the shoulders.   Electronically Signed   By: San Morelle M.D.   On: 04/29/2015 21:56   US Abdomen Complete  04/30/2015   CLINICAL DATA:  64 year old female with acute abdominal pain with vomiting for 4 days. Pain maximal in the right upper quadrant. Initial encounter.  EXAM: ULTRASOUND ABDOMEN COMPLETE  COMPARISON:  Abdomen radiographs 04/29/2015  FINDINGS: Gallbladder: No gallstones or wall thickening visualized. No sonographic Murphy sign noted.  Common bile duct: Diameter: 4 mm, normal  Liver: Heterogeneous and coarse echotexture (image 39). Large mildly septated cyst in the left lobe near the gallbladder fossa measuring 6.9 x 5.4 x 6.4 cm. There is a second septated cyst, subcapsular in location along the more posterior aspect of the liver measuring 7.6 x 4.5 x 7.1 cm. No intrahepatic biliary ductal dilatation.  IVC: No abnormality visualized.  Pancreas: Pancreatic parenchyma appears within normal limits. However, there is lobulated increased soft tissue near the pancreatic head and porta hepatis best seen on image 91 encompassing 54 x 23 mm. This might be lymphadenopathy.  Spleen: Size and appearance within normal limits.  Right Kidney: Length: 10.4 cm small septated but otherwise benign appearing lower pole cyst, 1.6 cm. Cortical echogenicity within normal limits. No hydronephrosis or other right renal lesion.  Left Kidney: Length: 10.1 cm. Possible 12 mm lower pole cyst. Echogenicity within normal limits. No mass or hydronephrosis visualized.  Abdominal aorta: No aneurysm visualized.  Other findings: Evidence of hepatofugal flow in the main portal vein (image 77).  IMPRESSION: 1. Combination of coarse liver echotexture and evidence of hepatic few will flow in the main portal vein suggest  chronic hepatocellular disease. There are 2 large and septated but otherwise benign appearing cysts in the liver, 6.9 and 7.6 cm. 2. Abnormal soft tissue near the head of the pancreas and porta hepatis suspicious for upper abdominal lymphadenopathy, up to 5.4 cm. 3. Recommend followup CT Abdomen and Pelvis with oral and IV contrast.   Electronically Signed   By: Genevie Ann M.D.   On: 04/30/2015 15:13   Ct Abdomen Pelvis W Contrast  04/30/2015   CLINICAL DATA:  Abdominal pain  EXAM: CT ABDOMEN AND PELVIS WITH CONTRAST  TECHNIQUE: Multidetector CT imaging of the abdomen and pelvis was performed using the standard protocol following bolus administration of intravenous contrast.  CONTRAST:  68mL OMNIPAQUE IOHEXOL 300 MG/ML SOLN, 159mL OMNIPAQUE IOHEXOL 300 MG/ML SOLN  COMPARISON:  Ultrasound same day  FINDINGS: Lung bases are unremarkable. Sagittal images of the spine shows degenerative changes thoracolumbar spine. Multiple cystic lesions are noted within liver. There is infiltrative lesion in inferior medial aspect of the right hepatic lobe measures 9.1 cm.  Irregular lesion in left hepatic lobe measures 2.4 cm. The pancreas shows normal enhancement. A periportal pathologic lymph node measures 1.8 cm. A portal caval pathologic lymph node measures 2.2 cm. No aortic aneurysm. Kidneys are symmetrical in size and enhancement. Adrenal glands are unremarkable. There is distension of the right colon with liquid stool. Distension of the transverse colon with air-fluid level. Distension of splenic flexure of the colon. Axial image 62 there is a obstructive colonic mass in descending colon measures at least 4.8 cm. There is abrupt change in caliber of descending colon. Distal left colon diverticula are noted. Multiple sigmoid colon diverticula. No evidence of acute diverticulitis. There is a retroflexed uterus. A probable uterine fibroid measures 2.4 cm. Degenerative changes bilateral hip joints. Small amount of free fluid within  pelvis. The urinary bladder is under distended.  IMPRESSION: 1. There is dominant infiltrative lesion in inferior aspect of right hepatic lobe measures 9/1 cm highly suspicious for metastatic disease. Additional lesion is noted in left hepatic lobe. Scattered hepatic cysts are noted. 2. There is periportal and portacaval adenopathy highly suspicious for metastatic disease. 3. There is distension of the right colon transverse colon and splenic flexure of the colon. There is obstructing colonic mass in descending colon axial image 56 measures at least 4.8 cm highly suspicious for colon carcinoma. Beyond the lesion the colon is small caliber collapsed. Colonic diverticula are noted distal end left colon and sigmoid colon. There is no evidence of acute diverticulitis. No definite evidence of acute colonic obstruction. Probable at least partial colonic obstruction. 4. There is retroflexed uterus. A probable fibroid within uterus measures 2.4 cm. 5. Small amount of free fluid noted within posterior pelvis. 6. Degenerative changes bilateral hip joints.   Electronically Signed   By: Lahoma Crocker M.D.   On: 04/30/2015 19:27   Dg Abd 2 Views  04/29/2015   CLINICAL DATA:  Mid abdominal pain x2 days, chest pain, nausea, weakness  EXAM: ABDOMEN - 2 VIEW  COMPARISON:  None.  FINDINGS: Nonobstructive bowel gas pattern.  No evidence of free air under the diaphragm on the upright view.  Moderate right colonic stool burden.  IMPRESSION: No evidence of small bowel obstruction or free air.  Moderate right colonic stool burden.   Electronically Signed   By: Julian Hy M.D.   On: 04/29/2015 21:56   Mm Digital Screening Bilateral  04/13/2015   CLINICAL DATA:  Screening.  EXAM: DIGITAL SCREENING BILATERAL MAMMOGRAM WITH CAD  COMPARISON:  None.  ACR Breast Density Category b: There are scattered areas of fibroglandular density.  FINDINGS: There are no findings suspicious for malignancy. Images were processed with CAD.   IMPRESSION: No mammographic evidence of malignancy. A result letter of this screening mammogram will be mailed directly to the patient.  RECOMMENDATION: Screening mammogram in one year. (Code:SM-B-01Y)  BI-RADS CATEGORY  1: Negative.   Electronically Signed   By: Lillia Mountain M.D.   On: 04/13/2015 16:24    Microbiology: No results found for this or any previous visit (from the past 240 hour(s)).   Labs: Basic Metabolic Panel:  Recent Labs Lab 04/29/15 2105 04/30/15 0542  NA 135 137  K 3.8 3.5  CL 100* 104  CO2 24 23  GLUCOSE 123* 128*  BUN 18 17  CREATININE 1.13* 1.11*  CALCIUM 9.2 9.0   Liver Function Tests:  Recent Labs Lab 04/29/15 2105 04/30/15 0542  AST 69* 59*  ALT 106* 95*  ALKPHOS 356* 342*  BILITOT 0.7 0.6  PROT 7.4 7.3  ALBUMIN 3.5 3.3*    Recent Labs Lab 04/29/15 2105  LIPASE 32   No results for input(s): AMMONIA in the last 168 hours. CBC:  Recent Labs Lab 04/29/15 2105 04/30/15 0542  WBC 9.6 10.4  HGB 9.9* 10.1*  HCT 30.4* 31.2*  MCV 88.1 88.1  PLT 285 353   Cardiac Enzymes:  Recent Labs Lab 04/29/15 2105  TROPONINI <0.03   BNP: BNP (last 3 results) No results for input(s): BNP in the last 8760 hours.  ProBNP (last 3 results) No results for input(s): PROBNP in the last 8760 hours.  CBG: No results for input(s): GLUCAP in the last 168 hours.     SignedLelon Frohlich  [facility] Pager: [phone] 05/01/2015, 11:48 AM

## 2015-05-02 LAB — CEA: CEA: 163.3 ng/mL — AB (ref 0.0–4.7)

## 2016-04-21 ENCOUNTER — Encounter (HOSPITAL_COMMUNITY): Payer: Self-pay | Admitting: *Deleted

## 2016-04-21 ENCOUNTER — Emergency Department (HOSPITAL_COMMUNITY)
Admission: EM | Admit: 2016-04-21 | Discharge: 2016-04-21 | Disposition: A | Discharge status: Home / Self Care | Payer: Medicare Other | Attending: Emergency Medicine | Admitting: Emergency Medicine

## 2016-04-21 ENCOUNTER — Emergency Department (HOSPITAL_COMMUNITY): Payer: Medicare Other

## 2016-04-21 DIAGNOSIS — Z7982 Long term (current) use of aspirin: Secondary | ICD-10-CM | POA: Diagnosis not present

## 2016-04-21 DIAGNOSIS — Z85038 Personal history of other malignant neoplasm of large intestine: Secondary | ICD-10-CM | POA: Insufficient documentation

## 2016-04-21 DIAGNOSIS — I10 Essential (primary) hypertension: Secondary | ICD-10-CM

## 2016-04-21 DIAGNOSIS — Z79899 Other long term (current) drug therapy: Secondary | ICD-10-CM | POA: Diagnosis not present

## 2016-04-21 HISTORY — DX: Malignant (primary) neoplasm, unspecified: C80.1

## 2016-04-21 LAB — URINALYSIS, ROUTINE W REFLEX MICROSCOPIC
Bilirubin Urine: NEGATIVE
GLUCOSE, UA: NEGATIVE mg/dL
Ketones, ur: NEGATIVE mg/dL
LEUKOCYTES UA: NEGATIVE
Nitrite: NEGATIVE
PH: 5.5 (ref 5.0–8.0)
PROTEIN: NEGATIVE mg/dL

## 2016-04-21 LAB — URINE MICROSCOPIC-ADD ON
BACTERIA UA: NONE SEEN
Squamous Epithelial / LPF: NONE SEEN
WBC, UA: NONE SEEN WBC/hpf (ref 0–5)

## 2016-04-21 LAB — I-STAT TROPONIN, ED: Troponin i, poc: 0 ng/mL (ref 0.00–0.08)

## 2016-04-21 MED ORDER — LORAZEPAM 0.5 MG PO TABS: 1.0000 mg | ORAL_TABLET | Freq: Three times a day (TID) | ORAL | 0 refills | Status: AC | PRN

## 2016-04-21 MED ORDER — FUROSEMIDE 10 MG/ML IJ SOLN
20.0000 mg | Freq: Once | INTRAMUSCULAR | Status: AC
  Administered 2016-04-21: 20 mg via INTRAVENOUS
  Filled 2016-04-21: qty 2

## 2016-04-21 MED ORDER — LORAZEPAM 0.5 MG PO TABS
0.5000 mg | ORAL_TABLET | Freq: Once | ORAL | Status: AC
  Administered 2016-04-21: 0.5 mg via ORAL
  Filled 2016-04-21: qty 1

## 2016-04-21 NOTE — ED Notes (Signed)
MD at bedside. Pt now states she is having chest pressure. Second EKG completed.

## 2016-04-21 NOTE — ED Notes (Signed)
MD at bedside. 

## 2016-04-21 NOTE — ED Provider Notes (Signed)
[facility] DEPT Provider Note   CSN: [id number] Arrival date & time: 04/21/16  [id number]  First Provider Contact:   First MD Initiated Contact with Patient 04/21/16 0932  By signing my name below, I, Dyke Brackett, attest that this documentation has been prepared under the direction and in the presence of Milton Ferguson, MD . Electronically Signed: Dyke Brackett, Scribe. 04/21/2016. 9:36 AM.  History   Chief Complaint Chief Complaint  Patient presents with  . Hypertension    HPI Katie Moss is a 65 y.o. female with PMHx of HTN and colon CA brought in by ambulance who presents to the Emergency Department complaining of hypertension onset yesterday. Per pt, her blood pressure is typically 110/ 70 but states it was 178/117 today. She reports taking Diovan and lisinopril today at 2 am with no relief. Per pt, she takes 5mg  lisinopril when she feels as if she needs it. Pt also complains of associated lightheadedness and edema. Pt states she was discharged from [facility] on 04/18/16 where she was admitted for fever. She has colon cancer with mets to her liver. Pt has no other complaints at this time  The history is provided by the patient. No language interpreter was used.   Past Medical History:  Diagnosis Date  . Cancer [facility])    Colon Cancer  . Hypertension     Patient Active Problem List   Diagnosis Date Noted  . Colonic mass 05/01/2015  . Hepatic metastases ([place]) 05/01/2015  . CN (constipation)   . Abdominal pain, acute 04/29/2015  . Constipation 04/29/2015  . Heme positive stool 04/29/2015  . Nausea & vomiting 04/29/2015  . Hypertension     Past Surgical History:  Procedure Laterality Date  . PORTACATH PLACEMENT      OB History    No data available       Home Medications    Prior to Admission medications   Medication Sig Start Date End Date Taking? Authorizing Provider  aspirin EC 81 MG tablet Take 81 mg by mouth daily.    Historical Provider, MD    ondansetron (ZOFRAN ODT) 8 MG disintegrating tablet Take 1 tablet (8 mg total) by mouth every 8 (eight) hours as needed for nausea or vomiting. 05/01/15   Erline Hau, MD  valsartan (DIOVAN) 320 MG tablet Take 320 mg by mouth daily.    Historical Provider, MD  VITAMIN E COMPLEX PO Take 1 capsule by mouth daily.    Historical Provider, MD    Family History No family history on file.  Social History Social History  Substance Use Topics  . Smoking status: Never Smoker  . Smokeless tobacco: Never Used  . Alcohol use No     Allergies   Review of patient's allergies indicates no known allergies.   Review of Systems Review of Systems  Constitutional: Negative for appetite change and fatigue.  HENT: Negative for congestion, ear discharge and sinus pressure.   Eyes: Negative for discharge.  Respiratory: Negative for cough.   Cardiovascular: Negative for chest pain.       + Hypertension  Gastrointestinal: Negative for abdominal pain and diarrhea.  Genitourinary: Negative for frequency and hematuria.  Musculoskeletal: Negative for back pain.  Skin: Negative for rash.  Neurological: Negative for seizures and headaches.  Psychiatric/Behavioral: Negative for hallucinations.     Physical Exam Updated Vital Signs BP (!) 177/105 (BP Location: Left Arm)   Pulse 99   Temp 98.1 F (36.7 C) (Oral)   Resp 16  Ht 5\' 7"  (1.702 m)   Wt 144 lb (65.3 kg)   SpO2 100%   BMI 22.55 kg/m   Physical Exam  Constitutional: She is oriented to person, place, and time. She appears cachectic.  HENT:  Head: Normocephalic.  Eyes: Conjunctivae and EOM are normal. No scleral icterus.  Neck: Neck supple. No thyromegaly present.  Cardiovascular: Normal rate and regular rhythm.  Exam reveals no gallop and no friction rub.   No murmur heard. Pulmonary/Chest: No stridor. She has no wheezes. She has no rales. She exhibits no tenderness.  Abdominal: She exhibits no distension. There is no  tenderness. There is no rebound.  Musculoskeletal: Normal range of motion. She exhibits edema.  1+ edema  Lymphadenopathy:    She has no cervical adenopathy.  Neurological: She is oriented to person, place, and time. She exhibits normal muscle tone. Coordination normal.  Skin: No rash noted. No erythema.  Psychiatric: She has a normal mood and affect. Her behavior is normal.     ED Treatments / Results  DIAGNOSTIC STUDIES:  Oxygen Saturation is 100% on RA, normal by my interpretation.    COORDINATION OF CARE:  9:35 AM Discussed treatment plan which includes Lasix with pt at bedside and pt agreed to plan.  Labs (all labs ordered are listed, but only abnormal results are displayed) Labs Reviewed  BASIC METABOLIC PANEL  CBC  URINALYSIS, ROUTINE W REFLEX MICROSCOPIC (NOT AT [facility])    EKG  EKG Interpretation None       Radiology No results found.  Procedures Procedures (including critical care time)  Medications Ordered in ED Medications - No data to display   Initial Impression / Assessment and Plan / ED Course  I have reviewed the triage vital signs and the nursing notes.  Pertinent labs & imaging results that were available during my care of the patient were reviewed by me and considered in my medical decision making (see chart for details).  Clinical Course  Patient with mild elevation of her blood pressure. She was told to increase her lisinopril at 10 mg a day and follow-up with her family doctor  Final Clinical Impressions(s) / ED Diagnoses   Final diagnoses:  None   New Prescriptions New Prescriptions   No medications on file     Milton Ferguson, MD 04/21/16 1356

## 2016-04-21 NOTE — ED Triage Notes (Signed)
Pt comes in by EMS for HTN. Pt woke up around 0200 today with weakness and increased BP. This persisted later in the morning so she called EMS. Pt has hx of HTN, taking medication as prescribed.   Pt has hx colon cancer with mets to liver.

## 2016-04-21 NOTE — ED Notes (Signed)
Pt had some concerns about her heart and bp, notified EDP and Dr. Roderic Palau is going in to speak with pt.

## 2016-04-21 NOTE — ED Notes (Signed)
MD canceled this nurses blood work. Lab made aware.

## 2016-04-21 NOTE — Discharge Instructions (Signed)
Increase your lisinopril to 10 mg a day and follow up with your family md next week

## 2016-04-21 NOTE — ED Notes (Signed)
Pt requested to be sent to [facility] to treat her BP.  MD notified.

## 2016-06-17 ENCOUNTER — Emergency Department (HOSPITAL_COMMUNITY): Payer: Medicare Other

## 2016-06-17 ENCOUNTER — Observation Stay (HOSPITAL_COMMUNITY)
Admission: EM | Admit: 2016-06-17 | Discharge: 2016-06-18 | Disposition: A | Discharge status: Home / Self Care | Payer: Medicare Other | Attending: Internal Medicine | Admitting: Internal Medicine

## 2016-06-17 ENCOUNTER — Encounter (HOSPITAL_COMMUNITY): Payer: Self-pay

## 2016-06-17 DIAGNOSIS — Z79899 Other long term (current) drug therapy: Secondary | ICD-10-CM | POA: Diagnosis not present

## 2016-06-17 DIAGNOSIS — D500 Iron deficiency anemia secondary to blood loss (chronic): Secondary | ICD-10-CM | POA: Diagnosis present

## 2016-06-17 DIAGNOSIS — R600 Localized edema: Secondary | ICD-10-CM | POA: Diagnosis present

## 2016-06-17 DIAGNOSIS — R64 Cachexia: Secondary | ICD-10-CM

## 2016-06-17 DIAGNOSIS — Z66 Do not resuscitate: Secondary | ICD-10-CM

## 2016-06-17 DIAGNOSIS — I10 Essential (primary) hypertension: Secondary | ICD-10-CM | POA: Diagnosis not present

## 2016-06-17 DIAGNOSIS — Z7982 Long term (current) use of aspirin: Secondary | ICD-10-CM | POA: Insufficient documentation

## 2016-06-17 DIAGNOSIS — Z85038 Personal history of other malignant neoplasm of large intestine: Secondary | ICD-10-CM | POA: Diagnosis not present

## 2016-06-17 DIAGNOSIS — D649 Anemia, unspecified: Secondary | ICD-10-CM | POA: Diagnosis present

## 2016-06-17 DIAGNOSIS — L98429 Non-pressure chronic ulcer of back with unspecified severity: Secondary | ICD-10-CM | POA: Diagnosis not present

## 2016-06-17 DIAGNOSIS — Z515 Encounter for palliative care: Secondary | ICD-10-CM

## 2016-06-17 DIAGNOSIS — R0600 Dyspnea, unspecified: Secondary | ICD-10-CM

## 2016-06-17 DIAGNOSIS — D62 Acute posthemorrhagic anemia: Secondary | ICD-10-CM | POA: Diagnosis not present

## 2016-06-17 DIAGNOSIS — L8990 Pressure ulcer of unspecified site, unspecified stage: Secondary | ICD-10-CM | POA: Insufficient documentation

## 2016-06-17 DIAGNOSIS — R0602 Shortness of breath: Secondary | ICD-10-CM | POA: Diagnosis present

## 2016-06-17 LAB — BASIC METABOLIC PANEL
ANION GAP: 8 (ref 5–15)
BUN: 16 mg/dL (ref 6–20)
CALCIUM: 9.6 mg/dL (ref 8.9–10.3)
CO2: 22 mmol/L (ref 22–32)
Chloride: 103 mmol/L (ref 101–111)
Creatinine, Ser: 0.72 mg/dL (ref 0.44–1.00)
GFR calc Af Amer: >60 mL/min (ref >60)
Glucose, Bld: 97 mg/dL (ref 65–99)
POTASSIUM: 3.9 mmol/L (ref 3.5–5.1)
SODIUM: 133 mmol/L — AB (ref 135–145)

## 2016-06-17 LAB — CBC WITH DIFFERENTIAL/PLATELET
BASOS ABS: 0 10*3/uL (ref 0.0–0.1)
BASOS PCT: 0 %
EOS PCT: 0 %
Eosinophils Absolute: 0 10*3/uL (ref 0.0–0.7)
HCT: 23 % — ABNORMAL LOW (ref 36.0–46.0)
Hemoglobin: 7.5 g/dL — ABNORMAL LOW (ref 12.0–15.0)
LYMPHS PCT: 10 %
Lymphs Abs: 1 10*3/uL (ref 0.7–4.0)
MCH: 29.2 pg (ref 26.0–34.0)
MCHC: 32.6 g/dL (ref 30.0–36.0)
MCV: 89.5 fL (ref 78.0–100.0)
Monocytes Absolute: 0.6 10*3/uL (ref 0.1–1.0)
Monocytes Relative: 6 %
Neutro Abs: 7.7 10*3/uL (ref 1.7–7.7)
Neutrophils Relative %: 84 %
PLATELETS: 278 10*3/uL (ref 150–400)
RBC: 2.57 MIL/uL — AB (ref 3.87–5.11)
RDW: 19.3 % — ABNORMAL HIGH (ref 11.5–15.5)
WBC: 9.2 10*3/uL (ref 4.0–10.5)

## 2016-06-17 LAB — TROPONIN I: TROPONIN I: 0.06 ng/mL — AB (ref <0.03)

## 2016-06-17 LAB — PREPARE RBC (CROSSMATCH)

## 2016-06-17 LAB — ABO/RH: ABO/RH(D): A POS

## 2016-06-17 MED ORDER — ACETAMINOPHEN 650 MG RE SUPP: 650.0000 mg | Freq: Four times a day (QID) | RECTAL | Status: DC | PRN

## 2016-06-17 MED ORDER — ASPIRIN EC 81 MG PO TBEC: 81.0000 mg | DELAYED_RELEASE_TABLET | Freq: Every day | ORAL | Status: DC

## 2016-06-17 MED ORDER — HYDROMORPHONE HCL 2 MG PO TABS: 2.0000 mg | ORAL_TABLET | ORAL | Status: DC | PRN

## 2016-06-17 MED ORDER — SODIUM CHLORIDE 0.9 % IV SOLN: 250.0000 mL | INTRAVENOUS | Status: DC | PRN

## 2016-06-17 MED ORDER — PROCHLORPERAZINE MALEATE 5 MG PO TABS: 10.0000 mg | ORAL_TABLET | Freq: Four times a day (QID) | ORAL | Status: DC | PRN

## 2016-06-17 MED ORDER — GABAPENTIN 300 MG PO CAPS
300.0000 mg | ORAL_CAPSULE | Freq: Three times a day (TID) | ORAL | Status: DC
  Filled 2016-06-17: qty 1

## 2016-06-17 MED ORDER — SODIUM CHLORIDE 0.9% FLUSH: 3.0000 mL | INTRAVENOUS | Status: DC | PRN

## 2016-06-17 MED ORDER — SUCRALFATE 1 G PO TABS
1.0000 g | ORAL_TABLET | Freq: Three times a day (TID) | ORAL | Status: DC
  Filled 2016-06-17: qty 1

## 2016-06-17 MED ORDER — ONDANSETRON HCL 4 MG/2ML IJ SOLN: 4.0000 mg | Freq: Four times a day (QID) | INTRAMUSCULAR | Status: DC | PRN

## 2016-06-17 MED ORDER — SODIUM CHLORIDE 0.9% FLUSH: 3.0000 mL | Freq: Two times a day (BID) | INTRAVENOUS | Status: DC

## 2016-06-17 MED ORDER — ENSURE ENLIVE PO LIQD: 237.0000 mL | Freq: Two times a day (BID) | ORAL | Status: DC

## 2016-06-17 MED ORDER — ONDANSETRON HCL 4 MG PO TABS: 4.0000 mg | ORAL_TABLET | Freq: Four times a day (QID) | ORAL | Status: DC | PRN

## 2016-06-17 MED ORDER — ACETAMINOPHEN 325 MG PO TABS: 650.0000 mg | ORAL_TABLET | Freq: Four times a day (QID) | ORAL | Status: DC | PRN

## 2016-06-17 MED ORDER — ATORVASTATIN CALCIUM 20 MG PO TABS: 20.0000 mg | ORAL_TABLET | Freq: Every day | ORAL | Status: DC

## 2016-06-17 MED ORDER — SODIUM CHLORIDE 0.9 % IV SOLN
Freq: Once | INTRAVENOUS | Status: AC
  Administered 2016-06-17: 22:00:00 via INTRAVENOUS

## 2016-06-17 MED ORDER — SPIRONOLACTONE 25 MG PO TABS: 25.0000 mg | ORAL_TABLET | Freq: Every day | ORAL | Status: DC

## 2016-06-17 MED ORDER — SODIUM CHLORIDE 0.9 % IV SOLN: 10.0000 mL/h | Freq: Once | INTRAVENOUS | Status: DC

## 2016-06-17 MED ORDER — FUROSEMIDE 10 MG/ML IJ SOLN
20.0000 mg | Freq: Three times a day (TID) | INTRAMUSCULAR | Status: DC
  Administered 2016-06-18: 20 mg via INTRAVENOUS
  Filled 2016-06-17: qty 2

## 2016-06-17 MED ORDER — BISACODYL 10 MG RE SUPP: 10.0000 mg | Freq: Every day | RECTAL | Status: DC | PRN

## 2016-06-17 MED ORDER — LISINOPRIL 5 MG PO TABS: 5.0000 mg | ORAL_TABLET | Freq: Every day | ORAL | Status: DC

## 2016-06-17 MED ORDER — LORAZEPAM 1 MG PO TABS: 1.0000 mg | ORAL_TABLET | Freq: Three times a day (TID) | ORAL | Status: DC | PRN

## 2016-06-17 NOTE — ED Triage Notes (Signed)
Patient reports of shortness of breath x1 hour. Has stage 4 colon cancer. Denies pain, n/v/d.

## 2016-06-17 NOTE — ED Notes (Signed)
Pt ready and stable for transport.  Report called to Marny Lowenstein [id number], RN.

## 2016-06-17 NOTE — H&P (Deleted)
[facility] History and Physical  MCKALEY SCHUPP [id number] DOB: Jun 27, 1951 DOA: 06/17/2016  Referring physician: Dr Christy Gentles PCP: Glo Herring., MD   Chief Complaint: Gen'd weakness, hx colon cancer.   HPI: Katie Moss is a 65 y.o. female dx'd in Aug 2016 w metastatic colon cancer. Treated w colonic stent and chemoRx.  In Jan '17 showed disease progression, chemo changed and had XRT to primary colon mass due to obstructing features. 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  Decided on hospice per ONC in Aug.  No further chemo since.  Is on hospice now. No other hospital notes since Aug 5th this year.    Patient presenting today with c/o gen'd weakness and SOB.  Found to have Hb 7.6, whereas Hb was 10.2 on Aug 13th this year. CXR was clear today.  She is DNR and on hospice, prefers blood transfusion as inpatient.  Asked to see for OBS admission.   Pt denies any gross blood in stool or black/ maroon stool.  No epistaxis. Pt has sig leg edema, tried lasix a couple doses for this w/o improvement.  Has poor appetite, most stools are loose, no sig abd pain, no fevers or chills.  Is WC -bound, non ambulatory due to progressive gen'd weakness.    Home meds > asas, lipitor, neurontin, Dilaudid po, prinivil, ativan, compazine, aldactone, carafate, Diovan  Chart review: Aug '16 - vomiting w abd pain 24 hrs, constipated which is new.  Heme +stool.  CT showed mass in desc colon w partial colonic obstruction, as well as a very large liver mass w smaller hepatic lesions as well.  Family/ pt requested transfer to [facility] as soon as CT results were back.  No diagnosis at time of dc.  Prob colon cancer.    Per Care Everywhere: Aug '16 - dx'd with low grade mod differentiated adenoCa of colon. Port placed, started on Folfox Rx, colonic stent placed.  CEA 138. CT chest R paratrach mass, L effusion,  ascites.  Nov '16 - liver lesions smaller, CEA down 45 Dec '16 - chemo held due to fevers, low WBC; chemo resume dw lower oxaliplatin dose Jan '17 - CT shows progression of disease, changed to [facility] Jan '17 - admitted w Ecoli sepsis, port removed , dc'd on IV abx w PICC Jan '17 - start new chemo w Irinotecan Feb '17 - anemic, prbc's. Then Flex sig showed tumor invading stent, tumor bleeding w GI blood loss. Seen by rad onc for XRT to primary site. cont'd chemoRx.  Mar '17 - completed XRT.   Apr '17 - chemo cont'd May '17 - CT not much change, stable liver lesions, slight ^ perit mets July '17 - oxaliplatin desensitatzation Rx for SE"s.  Aug /17 - CT w woresening perit mets, large pelvic implant containing air/ poss fisulta, new. dc'd w no chemoRx. Plan f/u scan 3 wks, if fistula present, won't be able to do chemo Aug '17 - admit for bacteremia Aug ' 17 - admit for bowel obsturction, new partial SBO prob due to peritoneal mets.  Colonic stent eroded into pelvic sidewall mass, extraluminal. Recommendation - HOSPICE.      Past Medical History  Past Medical History:  Diagnosis Date  . Cancer [facility])    Colon Cancer  . Hypertension    Past Surgical History  Past Surgical History:  Procedure Laterality Date  . [place]  History No family history on file. Social History  reports that she has never smoked. She has never used smokeless tobacco. She reports that she does not drink alcohol or use drugs. Allergies No Known Allergies Home medications Prior to Admission medications   Medication Sig Start Date End Date Taking? Authorizing Provider  aspirin EC 81 MG tablet Take 81 mg by mouth daily.   Yes Historical Provider, MD  atorvastatin (LIPITOR) 20 MG tablet Take 20 mg by mouth daily.   Yes Historical Provider, MD  gabapentin (NEURONTIN) 300 MG capsule Take 300 mg by mouth 3 (three) times daily.   Yes Historical Provider, MD  HYDROmorphone (DILAUDID) 2 MG  tablet Take 2 mg by mouth every 3 (three) hours as needed for moderate pain or severe pain.   Yes Historical Provider, MD  lisinopril (PRINIVIL,ZESTRIL) 5 MG tablet Take 5 mg by mouth daily.   Yes Historical Provider, MD  LORazepam (ATIVAN) 0.5 MG tablet Take 2 tablets (1 mg total) by mouth every 8 (eight) hours as needed for anxiety. 04/21/16  Yes Milton Ferguson, MD  prochlorperazine (COMPAZINE) 10 MG tablet Take 10 mg by mouth every 6 (six) hours as needed for nausea or vomiting.   Yes Historical Provider, MD  spironolactone (ALDACTONE) 25 MG tablet Take 25 mg by mouth daily.   Yes Historical Provider, MD  sucralfate (CARAFATE) 1 g tablet Take 1 g by mouth 4 (four) times daily -  before meals and at bedtime.   Yes Historical Provider, MD  valsartan (DIOVAN) 320 MG tablet Take 320 mg by mouth daily.   Yes Historical Provider, MD   Liver Function Tests No results for input(s): AST, ALT, ALKPHOS, BILITOT, PROT, ALBUMIN in the last 168 hours. No results for input(s): LIPASE, AMYLASE in the last 168 hours. CBC  Recent Labs Lab 06/17/16 1558  WBC 9.2  NEUTROABS 7.7  HGB 7.5*  HCT 23.0*  MCV 89.5  PLT 0000000   Basic Metabolic Panel  Recent Labs Lab 06/17/16 1558  NA 133*  K 3.9  CL 103  CO2 22  GLUCOSE 97  BUN 16  CREATININE 0.72  CALCIUM 9.6     Vitals:   06/17/16 1715 06/17/16 1730 06/17/16 1800 06/17/16 1830  BP:  108/76 115/75 116/87  Pulse: 90 88    Resp: 20 13 15 19   Temp:      TempSrc:      SpO2: 100% 100%    Weight:      Height:       Exam: Gen emaciated AAF no distress, very weak but pleasant No rash, cyanosis or gangrene Sclera anicteric, throat clear, moist  No jvd or bruits R upper chest SQ port accessed Chest clear bilat RRR no MRG Abd soft ntnd liver down 6-7 cm, L lower quad firmness, poss mass, +bS , no rebound GU defer MS no joint effusions or deformity Ext 2+ diffuse bilat LE edema / no wounds or ulcers Neuro is alert, Ox 3, moderate diffuse gen'd  weakness, nonamb   Na 133 K 3.9   BUN 16  Cr 0.72   Ca 9.6   Trop 0.06   WBC 9k  Hb 7.5  plt 278  UA - negative   EKG (independ reviewed) > sinus tach, 105 bpm, no acute changes CXR (independ reviewed) > clear w/o active disease   Assessment: 1. Anemia - prob GI blood loss, hx of known colonic mass w stage IV colon Ca.   2. Stage IV colon Ca -  now on hospice, has known liver mets, perit carcinomatosis, L pelvic sidewall mass, large L colon tumor w stent.  3. FTT 4. Hospice 5. DNR 6. HTN - bp's soft, will DC the ARB, cont lisinopril w hold parameters.  7. LE edema - IV lasix will here only x 2-3, would avoid strong diuretic at home however w comorbidities. TED hose placement in am.    Plan - admit, IV prbc's x 2u, lasix IV while here every 8 hours.  Support hose for at home for LE edema.      Roney Jaffe D [facility] Pager [phone]   If 7PM-7AM, please contact night-coverage [email or web address] Password [email or web address] 06/17/2016, 7:04 PM

## 2016-06-17 NOTE — ED Provider Notes (Signed)
[place] DEPT Provider Note   CSN: :[id number] Arrival date & time: 06/17/16  1516     History   Chief Complaint Chief Complaint  Patient presents with  . Shortness of Breath    HPI Katie Moss is a 65 y.o. female.  The history is provided by the patient and a relative.  Shortness of Breath  This is a new problem. Duration: just prior to arrival. The problem occurs continuously.The problem has been gradually worsening. Associated symptoms include cough. Pertinent negatives include no fever, no hemoptysis, no chest pain and no vomiting.  Patient with h/o metastatic colon CA, currently on hospice, presents with acute onset of shortness of breath that started prior to arrival.  Patient denies CP No fever She reports cough without hemoptysis She denies h/o CAD/PE No other acute issues at this time  Past Medical History:  Diagnosis Date  . Cancer [facility])    Colon Cancer  . Hypertension     Patient Active Problem List   Diagnosis Date Noted  . Colonic mass 05/01/2015  . Hepatic metastases ([place]) 05/01/2015  . CN (constipation)   . Abdominal pain, acute 04/29/2015  . Constipation 04/29/2015  . Heme positive stool 04/29/2015  . Nausea & vomiting 04/29/2015  . Hypertension     Past Surgical History:  Procedure Laterality Date  . PORTACATH PLACEMENT      OB History    No data available       Home Medications    Prior to Admission medications   Medication Sig Start Date End Date Taking? Authorizing Provider  aspirin EC 81 MG tablet Take 81 mg by mouth daily.    Historical Provider, MD  atorvastatin (LIPITOR) 20 MG tablet Take 20 mg by mouth daily.    Historical Provider, MD  gabapentin (NEURONTIN) 300 MG capsule Take 300 mg by mouth 3 (three) times daily.    Historical Provider, MD  HYDROmorphone (DILAUDID) 2 MG tablet Take 2 mg by mouth every 3 (three) hours as needed for moderate pain or severe pain.    Historical Provider, MD  lisinopril  (PRINIVIL,ZESTRIL) 5 MG tablet Take 5 mg by mouth daily.    Historical Provider, MD  LORazepam (ATIVAN) 0.5 MG tablet Take 2 tablets (1 mg total) by mouth every 8 (eight) hours as needed for anxiety. 04/21/16   Milton Ferguson, MD  prochlorperazine (COMPAZINE) 10 MG tablet Take 10 mg by mouth every 6 (six) hours as needed for nausea or vomiting.    Historical Provider, MD  spironolactone (ALDACTONE) 25 MG tablet Take 25 mg by mouth daily.    Historical Provider, MD  sucralfate (CARAFATE) 1 g tablet Take 1 g by mouth 4 (four) times daily -  before meals and at bedtime.    Historical Provider, MD  valsartan (DIOVAN) 320 MG tablet Take 320 mg by mouth daily.    Historical Provider, MD    Family History No family history on file.  Social History Social History  Substance Use Topics  . Smoking status: Never Smoker  . Smokeless tobacco: Never Used  . Alcohol use No     Allergies   Review of patient's allergies indicates no known allergies.   Review of Systems Review of Systems  Constitutional: Negative for fever.  HENT: Negative for trouble swallowing.   Respiratory: Positive for cough and shortness of breath. Negative for hemoptysis.   Cardiovascular: Negative for chest pain.  Gastrointestinal: Negative for blood in stool and vomiting.  Skin: Positive for wound.  Ulcer to sacrum   All other systems reviewed and are negative.    Physical Exam Updated Vital Signs BP 118/79 (BP Location: Right Arm)   Pulse 103   Temp 97.7 F (36.5 C) (Oral)   Resp 18   Ht 5\' 7"  (1.702 m)   Wt 49.9 kg   SpO2 100%   BMI 17.23 kg/m   Physical Exam CONSTITUTIONAL: chronically ill appearing.  cachectic HEAD: Normocephalic/atraumatic EYES: EOMI ENMT: Mucous membranes dry NECK: supple no meningeal signs CV: S1/S2 noted, no loud murmurs LUNGS: Lungs are clear to auscultation bilaterally, no apparent distress ABDOMEN: soft, nontender NEURO: Pt is awake/alert/appropriate, moves all  extremitiesx4.  EXTREMITIES: pulses normal/equal, full ROM, symmetric pitting edema to bilateral LE SKIN: warm, color normal, well healed sacral wound, no bleeding/drainage/erythema, chaperone present for exam PSYCH: no abnormalities of mood noted, alert and oriented to situation   ED Treatments / Results  Labs (all labs ordered are listed, but only abnormal results are displayed) Labs Reviewed  BASIC METABOLIC PANEL - Abnormal; Notable for the following:       Result Value   Sodium 133 (*)    All other components within normal limits  CBC WITH DIFFERENTIAL/PLATELET - Abnormal; Notable for the following:    RBC 2.57 (*)    Hemoglobin 7.5 (*)    HCT 23.0 (*)    RDW 19.3 (*)    All other components within normal limits  TROPONIN I - Abnormal; Notable for the following:    Troponin I 0.06 (*)    All other components within normal limits  TYPE AND SCREEN  PREPARE RBC (CROSSMATCH)    EKG  EKG Interpretation  Date/Time:  Sunday June 17 2016 15:29:43 EDT Ventricular Rate:  105 PR Interval:    QRS Duration: 60 QT Interval:  392 QTC Calculation: 519 R Axis:   91 Text Interpretation:  Sinus tachycardia Ventricular premature complex Aberrant conduction of SV complex(es) Right axis deviation Low voltage, precordial leads Nonspecific T abnrm, anterolateral leads Prolonged QT interval Confirmed by Christy Gentles  MD, Daiden Coltrane ([id number]) on 06/17/2016 3:39:37 PM       Radiology Dg Chest 2 View  Result Date: 06/17/2016 CLINICAL DATA:  65 year old female with shortness of breath. Initial encounter. Stage IV colon cancer. EXAM: CHEST  2 VIEW COMPARISON:  04/21/2016 and earlier. FINDINGS: Portable AP semi upright view at 1611 hours. Continued elevation of the right hemidiaphragm. Mediastinal contours remain normal. Stable right chest porta cath. Skin fold artifact at the left lateral lung base (arrow). No pneumothorax. Allowing for portable technique the lungs are clear. IMPRESSION: No acute  cardiopulmonary abnormality. Skin fold artifact at the left lateral lung base. Electronically Signed   By: Genevie Ann M.D.   On: 06/17/2016 16:38    Procedures Procedures (including critical care time)  Medications Ordered in ED Medications  0.9 %  sodium chloride infusion (not administered)     Initial Impression / Assessment and Plan / ED Course  I have reviewed the triage vital signs and the nursing notes.  Pertinent labs & imaging results that were available during my care of the patient were reviewed by me and considered in my medical decision making (see chart for details).  Clinical Course    3:49 PM Pt with h/o metastatic colon CA currently on hospice.  I reviewed recent oncology notes that indicate patient is on hospice She presents with acute SOB She is currently hemodynamically appropriate Labs/imaging reviewed 5:44 PM Pt is awake/alert CXR negative Pt is  noted to be anemic. This appears acute, as labs from late last month, HGB >12 Dyspnea could be from anemia - she denies recent blood loss She would feel more comfortable in hospital She confirms she is a DNR And is on hospice However, she welcomes blood transfusion Defer further workup at this time   6:26 PM D/w dr Jonnie Finner for admission Will admit for transfusion and monitoring Pt is DNR  Final Clinical Impressions(s) / ED Diagnoses   Final diagnoses:  Acute blood loss anemia  Dyspnea, unspecified type    New Prescriptions New Prescriptions   No medications on file     Ripley Fraise, MD 06/17/16 1827

## 2016-06-17 NOTE — ED Notes (Addendum)
Right chest port accessed using sterile technique running normal saline at [facility].

## 2016-06-17 NOTE — H&P (Signed)
[facility] History and Physical  Katie Moss [id number] DOB: 1951/04/29 DOA: 06/17/2016  Referring physician: Dr Christy Gentles PCP: Glo Herring., MD   Chief Complaint: Gen'd weakness, hx colon cancer.   HPI: Katie Moss is a 65 y.o. female dx'd in Aug 2016 w metastatic colon cancer. Treated w colonic stent and chemoRx.  In Jan '17 showed disease progression, chemo changed and had XRT to primary colon mass due to obstructing features. 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  Decided on hospice per ONC in Aug.  No further chemo since.  Is on hospice now. No other hospital notes since Aug 5th this year.    Patient presenting today with c/o gen'd weakness and SOB.  Found to have Hb 7.6, whereas Hb was 10.2 on Aug 13th this year. CXR was clear today.  She is DNR and on hospice, prefers blood transfusion as inpatient.  Asked to see for OBS admission.   Pt denies any gross blood in stool or black/ maroon stool.  No epistaxis. Pt has sig leg edema, tried lasix a couple doses for this w/o improvement.  Has poor appetite, most stools are loose, no sig abd pain, no fevers or chills.  Is WC -bound, non ambulatory due to progressive gen'd weakness.    Home meds > asas, lipitor, neurontin, Dilaudid po, prinivil, ativan, compazine, aldactone, carafate, Diovan  Chart review: Aug '16 - vomiting w abd pain 24 hrs, constipated which is new.  Heme +stool.  CT showed mass in desc colon w partial colonic obstruction, as well as a very large liver mass w smaller hepatic lesions as well.  Family/ pt requested transfer to [facility] as soon as CT results were back.  No diagnosis at time of dc.  Prob colon cancer.    Per Care Everywhere: Aug '16 - dx'd with low grade mod differentiated adenoCa of colon. Port placed, started on Folfox Rx, colonic stent placed.  CEA 138. CT chest R paratrach mass, L effusion,  ascites.  Nov '16 - liver lesions smaller, CEA down 45 Dec '16 - chemo held due to fevers, low WBC; chemo resume dw lower oxaliplatin dose Jan '17 - CT shows progression of disease, changed to [facility] Jan '17 - admitted w Ecoli sepsis, port removed , dc'd on IV abx w PICC Jan '17 - start new chemo w Irinotecan Feb '17 - anemic, prbc's. Then Flex sig showed tumor invading stent, tumor bleeding w GI blood loss. Seen by rad onc for XRT to primary site. cont'd chemoRx.  Mar '17 - completed XRT.   Apr '17 - chemo cont'd May '17 - CT not much change, stable liver lesions, slight ^ perit mets July '17 - oxaliplatin desensitatzation Rx for SE"s.  Aug /17 - CT w woresening perit mets, large pelvic implant containing air/ poss fisulta, new. dc'd w no chemoRx. Plan f/u scan 3 wks, if fistula present, won't be able to do chemo Aug '17 - admit for bacteremia Aug ' 17 - admit for bowel obsturction, new partial SBO prob due to peritoneal mets.  Colonic stent eroded into pelvic sidewall mass, extraluminal. Recommendation - HOSPICE.      Past Medical History  Past Medical History:  Diagnosis Date  . Cancer [facility])    Colon Cancer  . Hypertension    Past Surgical History  Past Surgical History:  Procedure Laterality Date  . [place]  History No family history on file. Social History  reports that she has never smoked. She has never used smokeless tobacco. She reports that she does not drink alcohol or use drugs. Allergies No Known Allergies Home medications Prior to Admission medications   Medication Sig Start Date End Date Taking? Authorizing Provider  aspirin EC 81 MG tablet Take 81 mg by mouth daily.   Yes Historical Provider, MD  atorvastatin (LIPITOR) 20 MG tablet Take 20 mg by mouth daily.   Yes Historical Provider, MD  gabapentin (NEURONTIN) 300 MG capsule Take 300 mg by mouth 3 (three) times daily.   Yes Historical Provider, MD  HYDROmorphone (DILAUDID) 2 MG  tablet Take 2 mg by mouth every 3 (three) hours as needed for moderate pain or severe pain.   Yes Historical Provider, MD  lisinopril (PRINIVIL,ZESTRIL) 5 MG tablet Take 5 mg by mouth daily.   Yes Historical Provider, MD  LORazepam (ATIVAN) 0.5 MG tablet Take 2 tablets (1 mg total) by mouth every 8 (eight) hours as needed for anxiety. 04/21/16  Yes Milton Ferguson, MD  prochlorperazine (COMPAZINE) 10 MG tablet Take 10 mg by mouth every 6 (six) hours as needed for nausea or vomiting.   Yes Historical Provider, MD  spironolactone (ALDACTONE) 25 MG tablet Take 25 mg by mouth daily.   Yes Historical Provider, MD  sucralfate (CARAFATE) 1 g tablet Take 1 g by mouth 4 (four) times daily -  before meals and at bedtime.   Yes Historical Provider, MD  valsartan (DIOVAN) 320 MG tablet Take 320 mg by mouth daily.   Yes Historical Provider, MD   Liver Function Tests No results for input(s): AST, ALT, ALKPHOS, BILITOT, PROT, ALBUMIN in the last 168 hours. No results for input(s): LIPASE, AMYLASE in the last 168 hours. CBC  Recent Labs Lab 06/17/16 1558  WBC 9.2  NEUTROABS 7.7  HGB 7.5*  HCT 23.0*  MCV 89.5  PLT 0000000   Basic Metabolic Panel  Recent Labs Lab 06/17/16 1558  NA 133*  K 3.9  CL 103  CO2 22  GLUCOSE 97  BUN 16  CREATININE 0.72  CALCIUM 9.6     Vitals:   06/17/16 1830 06/17/16 2055 06/17/16 2159 06/17/16 2230  BP: 116/87 (!) 96/57 107/64 98/60  Pulse:  90 93 82  Resp: 19 16 18 16   Temp:  97.9 F (36.6 C) 97.6 F (36.4 C) 97.7 F (36.5 C)  TempSrc:  Oral Oral Oral  SpO2:  100% 100% 100%  Weight:  59.2 kg (130 lb 8.2 oz)    Height:  5\' 7"  (1.702 m)     Exam: Gen emaciated AAF no distress, very weak but pleasant No rash, cyanosis or gangrene Sclera anicteric, throat clear, moist  No jvd or bruits R upper chest SQ port accessed Chest clear bilat RRR no MRG Abd soft ntnd liver down 6-7 cm, L lower quad firmness, poss mass, +bS , no rebound GU defer MS no joint  effusions or deformity Ext 2+ diffuse bilat LE edema / no wounds or ulcers Neuro is alert, Ox 3, moderate diffuse gen'd weakness, nonamb   Na 133 K 3.9   BUN 16  Cr 0.72   Ca 9.6   Trop 0.06   WBC 9k  Hb 7.5  plt 278  UA - negative   EKG (independ reviewed) > sinus tach, 105 bpm, no acute changes CXR (independ reviewed) > clear w/o active disease   Assessment: 1. Anemia - prob GI blood  loss, hx of known colonic mass w stage IV colon Ca.   2. Stage IV colon Ca - now on hospice, has known liver mets, perit carcinomatosis, L pelvic sidewall mass, large L colon tumor w stent.  3. FTT 4. Hospice 5. DNR 6. HTN - bp's soft, will DC the ARB, cont lisinopril w hold parameters.  7. LE edema - IV lasix will here only x 2-3, would avoid strong diuretic at home however w comorbidities. TED hose placement in am.    Plan - admit, IV prbc's x 2u, lasix IV while here every 8 hours.  Support hose for at home for LE edema.      Roney Jaffe D [facility] Pager [phone]   If 7PM-7AM, please contact night-coverage [email or web address] Password [email or web address] 06/17/2016, 7:05 PM

## 2016-06-17 NOTE — ED Notes (Signed)
CRITICAL VALUE ALERT  Critical value received:  Tropinin = 0.06  Date of notification:  06/17/2016  Time of notification:  1656  Critical value read back:Yes.    Nurse who received alert:  Rosealee Albee  MD notified (1st page):  [place]  Time of first page:  1658  MD notified (2nd page):  Time of second page:  Responding MD:    Time MD responded:

## 2016-06-18 DIAGNOSIS — D62 Acute posthemorrhagic anemia: Secondary | ICD-10-CM | POA: Diagnosis not present

## 2016-06-18 DIAGNOSIS — Z515 Encounter for palliative care: Secondary | ICD-10-CM | POA: Diagnosis not present

## 2016-06-18 DIAGNOSIS — D500 Iron deficiency anemia secondary to blood loss (chronic): Secondary | ICD-10-CM | POA: Diagnosis not present

## 2016-06-18 DIAGNOSIS — Z85038 Personal history of other malignant neoplasm of large intestine: Secondary | ICD-10-CM

## 2016-06-18 DIAGNOSIS — L8990 Pressure ulcer of unspecified site, unspecified stage: Secondary | ICD-10-CM | POA: Insufficient documentation

## 2016-06-18 LAB — CBC
HEMATOCRIT: 31.2 % — AB (ref 36.0–46.0)
HEMOGLOBIN: 10.3 g/dL — AB (ref 12.0–15.0)
MCH: 28.7 pg (ref 26.0–34.0)
MCHC: 33 g/dL (ref 30.0–36.0)
MCV: 86.9 fL (ref 78.0–100.0)
Platelets: 216 10*3/uL (ref 150–400)
RBC: 3.59 MIL/uL — AB (ref 3.87–5.11)
RDW: 17.2 % — ABNORMAL HIGH (ref 11.5–15.5)
WBC: 8.4 10*3/uL (ref 4.0–10.5)

## 2016-06-18 LAB — PREPARE RBC (CROSSMATCH)

## 2016-06-18 MED ORDER — ORAL CARE MOUTH RINSE: 15.0000 mL | Freq: Two times a day (BID) | OROMUCOSAL | Status: DC

## 2016-06-18 MED ORDER — HEPARIN SOD (PORK) LOCK FLUSH 100 UNIT/ML IV SOLN
500.0000 [IU] | INTRAVENOUS | Status: AC | PRN
  Administered 2016-06-18: 500 [IU]
  Filled 2016-06-18: qty 5

## 2016-06-18 NOTE — Progress Notes (Signed)
Port-A-Cath flushed with Heparin and needle removed intact w/o S&S of complications.  Guaze applied and secured with tape. Pt tolerated procedure well.

## 2016-06-18 NOTE — Care Management Note (Signed)
Case Management Note  Patient Details  Name: Katie Moss MRN: [id number] Date of Birth: December 27, 1950  Subjective/Objective:  Patient adm from home with with anemia, received PRBC and discharging home today patient.. She is active with [facility] hospice. She plans to return home and resume home hospice services.                 Action/Plan: Notified [facility] hosp[facility] of admission. Will fax DC summary when available.    Expected Discharge Date:  06/20/16               Expected Discharge Plan:  Home w Hospice Care  In-House Referral:  NA  Discharge planning Services  CM Consult  Post Acute Care Choice:  NA Choice offered to:  NA  DME Arranged:    DME Agency:     HH Arranged:    HH Agency:     Status of Service:  Completed, signed off  If discussed at [employer] of Stay Meetings, dates discussed:    Additional Comments:  [name], Chauncey Reading, RN 06/18/2016, 11:22 AM

## 2016-06-18 NOTE — Discharge Summary (Signed)
Physician Discharge Summary  Katie Moss [id number] DOB: 08/23/1951 DOA: 06/17/2016  PCP: Glo Herring., MD  Admit date: 06/17/2016 Discharge date: 06/18/2016  Admitted From: Home with hospice. Disposition:  Home with hospice.   Recommendations for Outpatient Follow-up:  1. Follow up with PCP in 1-2 weeks  Home Health: as previously.  Equipment/Devices: none.  Discharge Condition: slight improvement.  CODE STATUS: DNR/ Hospice care.  Diet recommendation: liquid only.   Brief/Interim Summary: Patient with metastatic colon can, s/p colonic stent, chemotherapy and Rx, admitted by Dr Jonnie Finner for transfusion on Oct 1, 17.  As per his H and P:  " Katie Moss is a 65 y.o. female dx'd in Aug 2016 w metastatic colon cancer. Treated w colonic stent and chemoRx.  In Jan '17 showed disease progression, chemo changed and had XRT to primary colon mass due to obstructing features. 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  Decided on hospice per ONC in Aug.  No further chemo since.  Is on hospice now. No other hospital notes since Aug 5th this year.    Patient presenting today with c/o gen'd weakness and SOB.  Found to have Hb 7.6, whereas Hb was 10.2 on Aug 13th this year. CXR was clear today.  She is DNR and on hospice, prefers blood transfusion as inpatient.  Asked to see for OBS admission.   Pt denies any gross blood in stool or black/ maroon stool.  No epistaxis. Pt has sig leg edema, tried lasix a couple doses for this w/o improvement.  Has poor appetite, most stools are loose, no sig abd pain, no fevers or chills.  Is WC -bound, non ambulatory due to progressive gen'd weakness.    Home meds > asas, lipitor, neurontin, Dilaudid po, prinivil, ativan, compazine, aldactone, carafate, Diovan  Chart review: Aug '16 - vomiting w abd pain 24 hrs, constipated which is new.  Heme +stool.  CT  showed mass in desc colon w partial colonic obstruction, as well as a very large liver mass w smaller hepatic lesions as well.  Family/ pt requested transfer to [facility] as soon as CT results were back.  No diagnosis at time of dc.  Prob colon cancer.    Per Care Everywhere: Aug '16 - dx'd with low grade mod differentiated adenoCa of colon. Port placed, started on Folfox Rx, colonic stent placed.  CEA 138. CT chest R paratrach mass, L effusion, ascites.  Nov '16 - liver lesions smaller, CEA down 45 Dec '16 - chemo held due to fevers, low WBC; chemo resume dw lower oxaliplatin dose Jan '17 - CT shows progression of disease, changed to [facility] Jan '17 - admitted w Ecoli sepsis, port removed , dc'd on IV abx w PICC Jan '17 - start new chemo w Irinotecan Feb '17 - anemic, prbc's. Then Flex sig showed tumor invading stent, tumor bleeding w GI blood loss. Seen by rad onc for XRT to primary site. cont'd chemoRx.  Mar '17 - completed XRT.   Apr '17 - chemo cont'd May '17 - CT not much change, stable liver lesions, slight ^ perit mets July '17 - oxaliplatin desensitatzation Rx for SE"s.  Aug /17 - CT w woresening perit mets, large pelvic implant containing air/ poss fisulta, new. dc'd w no chemoRx. 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  Colonic stent eroded into pelvic sidewall mass, extraluminal. Recommendation - HOSPICE.   HOSPITAL COURSE:  Patient was admitted OBS, and was given 2 units of PRBC bringing her Hb to 10 g per dL, which is her baseline.  She felt slightly better.  Her meds were resume, but she refused to take them.  She would like to go home now, and we will discharge her back to her home under hospice care as before.  She was admitted for transfusions, and she tolerated 2 units well.  Thank you and Good Day.   Discharge Diagnoses:  Principal  Problem:   Anemia, blood loss Active Problems:   Hypertension   Anemia   H/O colon cancer, stage IV   Hospice care patient   DNR (do not resuscitate)   Lower extremity edema   Pressure injury of skin    Discharge Instructions  Discharge Instructions    Diet - low sodium heart healthy    Complete by:  As directed    Discharge instructions    Complete by:  As directed    Follow up with hospice this week.   Increase activity slowly    Complete by:  As directed        Medication List    TAKE these medications   aspirin EC 81 MG tablet Take 81 mg by mouth daily.   atorvastatin 20 MG tablet Commonly known as:  LIPITOR Take 20 mg by mouth daily.   gabapentin 300 MG capsule Commonly known as:  NEURONTIN Take 300 mg by mouth 3 (three) times daily.   HYDROmorphone 2 MG tablet Commonly known as:  DILAUDID Take 2 mg by mouth every 3 (three) hours as needed for moderate pain or severe pain.   lisinopril 5 MG tablet Commonly known as:  PRINIVIL,ZESTRIL Take 5 mg by mouth daily.   LORazepam 0.5 MG tablet Commonly known as:  ATIVAN Take 2 tablets (1 mg total) by mouth every 8 (eight) hours as needed for anxiety.   prochlorperazine 10 MG tablet Commonly known as:  COMPAZINE Take 10 mg by mouth every 6 (six) hours as needed for nausea or vomiting.   spironolactone 25 MG tablet Commonly known as:  ALDACTONE Take 25 mg by mouth daily.   sucralfate 1 g tablet Commonly known as:  CARAFATE Take 1 g by mouth 4 (four) times daily -  before meals and at bedtime.       No Known Allergies  Consultations:  None.   Procedures/Studies: Dg Chest 2 View  Result Date: 06/17/2016 CLINICAL DATA:  65 year old female with shortness of breath. Initial encounter. Stage IV colon cancer. EXAM: CHEST  2 VIEW COMPARISON:  04/21/2016 and earlier. FINDINGS: Portable AP semi upright view at 1611 hours. Continued elevation of the right hemidiaphragm. Mediastinal contours remain normal.  Stable right chest porta cath. Skin fold artifact at the left lateral lung base (arrow). No pneumothorax. Allowing for portable technique the lungs are clear. IMPRESSION: No acute cardiopulmonary abnormality. Skin fold artifact at the left lateral lung base. Electronically Signed   By: Genevie Ann M.D.   On: 06/17/2016 16:38      Subjective:  Feeling slightly better.   Discharge Exam: Vitals:   06/18/16 0117 06/18/16 0300  BP: 110/64 116/67  Pulse: 79 84  Resp: 18 18  Temp: 97.8 F (36.6 C) 97.5 F (36.4 C)   Vitals:   06/17/16 2230 06/18/16 0036 06/18/16 [id number]  06/18/16 0300  BP: 98/60 109/62 110/64 116/67  Pulse: 82  79 84  Resp: 16 18 18 18   Temp: 97.7 F (36.5 C) 97.7 F (36.5 C) 97.8 F (36.6 C) 97.5 F (36.4 C)  TempSrc: Oral Oral Oral Oral  SpO2: 100% 100% 100% 100%  Weight:      Height:        General: Pt is alert, awake, not in acute distress Cardiovascular: RRR, S1/S2 +, no rubs, no gallops Respiratory: CTA bilaterally, no wheezing, no rhonchi Abdominal: Soft, NT, ND, bowel sounds + Extremities: no edema, no cyanosis    The results of significant diagnostics from this hospitalization (including imaging, microbiology, ancillary and laboratory) are listed below for reference.    Labs: BNP (last 3 results) No results for input(s): BNP in the last 8760 hours. Basic Metabolic Panel:  Recent Labs Lab 06/17/16 1558  NA 133*  K 3.9  CL 103  CO2 22  GLUCOSE 97  BUN 16  CREATININE 0.72  CALCIUM 9.6   CBC:  Recent Labs Lab 06/17/16 1558 06/18/16 0709  WBC 9.2 8.4  NEUTROABS 7.7  --   HGB 7.5* 10.3*  HCT 23.0* 31.2*  MCV 89.5 86.9  PLT 278 216   Cardiac Enzymes:  Recent Labs Lab 06/17/16 1558  TROPONINI 0.06*   Anemia work up No results for input(s): VITAMINB12, FOLATE, FERRITIN, TIBC, IRON, RETICCTPCT in the last 72 hours. Urinalysis    Component Value Date/Time   COLORURINE YELLOW 04/21/2016 0926   APPEARANCEUR CLEAR 04/21/2016 0926    LABSPEC <1.005 (L) 04/21/2016 0926   PHURINE 5.5 04/21/2016 0926   GLUCOSEU NEGATIVE 04/21/2016 0926   HGBUR SMALL (A) 04/21/2016 0926   BILIRUBINUR NEGATIVE 04/21/2016 0926   KETONESUR NEGATIVE 04/21/2016 0926   PROTEINUR NEGATIVE 04/21/2016 0926   UROBILINOGEN 0.2 08/27/2007 0156   NITRITE NEGATIVE 04/21/2016 0926   LEUKOCYTESUR NEGATIVE 04/21/2016 0926   Time coordinating discharge: Over 30 minutes  SIGNED:   Orvan Falconer, MD FACP [facility] 06/18/2016, 12:33 PM   If 7PM-7AM, please contact night-coverage [email or web address] Password [email or web address]

## 2016-06-18 NOTE — Care Management Obs Status (Signed)
[place] NOTIFICATION   Patient Details  Name: ELFREDIA MACHEN MRN: [id number] Date of Birth: Sep 08, 1951   Medicare Observation Status Notification Given:  Yes    Jasha Hodzic, Chauncey Reading, RN 06/18/2016, 11:15 AM

## 2016-06-18 NOTE — Progress Notes (Signed)
Patient has refused all medications and ensure this morning.  States she has not taken any meds in 2 months.

## 2016-06-19 NOTE — Progress Notes (Signed)
Patient discharged home.  Verbalizes understanding of DC instructions.  Assisted off unit via WC in NAD.

## 2016-06-22 LAB — TYPE AND SCREEN
ABO/RH(D): A POS
ANTIBODY SCREEN: NEGATIVE
UNIT DIVISION: 0
Unit division: 0
Unit division: 0
Unit division: 0

## 2016-07-06 ENCOUNTER — Emergency Department (HOSPITAL_COMMUNITY)

## 2016-07-06 ENCOUNTER — Emergency Department (HOSPITAL_COMMUNITY)
Admission: EM | Admit: 2016-07-06 | Discharge: 2016-07-06 | Disposition: A | Discharge status: Home / Self Care | Attending: Emergency Medicine | Admitting: Emergency Medicine

## 2016-07-06 ENCOUNTER — Encounter (HOSPITAL_COMMUNITY): Payer: Self-pay

## 2016-07-06 DIAGNOSIS — R110 Nausea: Secondary | ICD-10-CM | POA: Diagnosis not present

## 2016-07-06 DIAGNOSIS — R0602 Shortness of breath: Secondary | ICD-10-CM | POA: Insufficient documentation

## 2016-07-06 DIAGNOSIS — Z7982 Long term (current) use of aspirin: Secondary | ICD-10-CM | POA: Diagnosis not present

## 2016-07-06 DIAGNOSIS — Z79899 Other long term (current) drug therapy: Secondary | ICD-10-CM | POA: Insufficient documentation

## 2016-07-06 DIAGNOSIS — R079 Chest pain, unspecified: Secondary | ICD-10-CM | POA: Insufficient documentation

## 2016-07-06 DIAGNOSIS — I10 Essential (primary) hypertension: Secondary | ICD-10-CM | POA: Diagnosis not present

## 2016-07-06 DIAGNOSIS — C189 Malignant neoplasm of colon, unspecified: Secondary | ICD-10-CM | POA: Diagnosis not present

## 2016-07-06 LAB — I-STAT TROPONIN, ED
TROPONIN I, POC: 0.01 ng/mL (ref 0.00–0.08)
Troponin i, poc: 0.03 ng/mL (ref 0.00–0.08)

## 2016-07-06 LAB — CBC WITH DIFFERENTIAL/PLATELET
Basophils Absolute: 0 10*3/uL (ref 0.0–0.1)
Basophils Relative: 0 %
EOS PCT: 0 %
Eosinophils Absolute: 0 10*3/uL (ref 0.0–0.7)
HEMATOCRIT: 27.7 % — AB (ref 36.0–46.0)
Hemoglobin: 9.3 g/dL — ABNORMAL LOW (ref 12.0–15.0)
LYMPHS ABS: 0.8 10*3/uL (ref 0.7–4.0)
LYMPHS PCT: 8 %
MCH: 29.4 pg (ref 26.0–34.0)
MCHC: 33.6 g/dL (ref 30.0–36.0)
MCV: 87.7 fL (ref 78.0–100.0)
MONO ABS: 0.3 10*3/uL (ref 0.1–1.0)
Monocytes Relative: 3 %
NEUTROS ABS: 9.3 10*3/uL — AB (ref 1.7–7.7)
Neutrophils Relative %: 89 %
PLATELETS: 224 10*3/uL (ref 150–400)
RBC: 3.16 MIL/uL — AB (ref 3.87–5.11)
RDW: 16.8 % — ABNORMAL HIGH (ref 11.5–15.5)
WBC: 10.4 10*3/uL (ref 4.0–10.5)

## 2016-07-06 LAB — BASIC METABOLIC PANEL
ANION GAP: 13 (ref 5–15)
BUN: 21 mg/dL — AB (ref 6–20)
CO2: 19 mmol/L — AB (ref 22–32)
Calcium: 9.4 mg/dL (ref 8.9–10.3)
Chloride: 95 mmol/L — ABNORMAL LOW (ref 101–111)
Creatinine, Ser: 0.91 mg/dL (ref 0.44–1.00)
GFR calc Af Amer: >60 mL/min (ref >60)
GLUCOSE: 69 mg/dL (ref 65–99)
POTASSIUM: 4.1 mmol/L (ref 3.5–5.1)
Sodium: 127 mmol/L — ABNORMAL LOW (ref 135–145)

## 2016-07-06 MED ORDER — SODIUM CHLORIDE 0.9 % IV SOLN
INTRAVENOUS | Status: DC
  Administered 2016-07-06: 16:00:00 via INTRAVENOUS

## 2016-07-06 NOTE — ED Notes (Signed)
MD at bedside. 

## 2016-07-06 NOTE — ED Provider Notes (Signed)
[place] DEPT Provider Note   CSN: [id number] Arrival date & time: 07/06/16  1439     History   Chief Complaint Chief Complaint  Patient presents with  . Chest Pain    HPI Katie Moss is a 65 y.o. female.  Patient with end-stage metastatic colon cancer under hospice care. Patient then placed in hospice since August. No further treatments available. Patient brought in by EMS for left jaw pain and chest pain that started last evening. Patient also felt that time short of breath on room air she was satting 100%. Patient denied any chest pain upon arrival. But still had the left jaw pain.      Past Medical History:  Diagnosis Date  . [facility])    Colon Cancer  . Hypertension     Patient Active Problem List   Diagnosis Date Noted  . Pressure injury of skin 06/18/2016  . Anemia 06/17/2016  . H/O colon cancer, stage IV 06/17/2016  . Hospice care patient 06/17/2016  . DNR (do not resuscitate) 06/17/2016  . Lower extremity edema 06/17/2016  . Anemia, blood loss 06/17/2016  . Colonic mass 05/01/2015  . Hepatic metastases ([place]) 05/01/2015  . CN (constipation)   . Abdominal pain, acute 04/29/2015  . Constipation 04/29/2015  . Heme positive stool 04/29/2015  . Nausea & vomiting 04/29/2015  . Hypertension     Past Surgical History:  Procedure Laterality Date  . PORTACATH PLACEMENT      OB History    No data available       Home Medications    Prior to Admission medications   Medication Sig Start Date End Date Taking? Authorizing Provider  aspirin EC 81 MG tablet Take 81 mg by mouth daily.    Historical Provider, MD  atorvastatin (LIPITOR) 20 MG tablet Take 20 mg by mouth daily.    Historical Provider, MD  gabapentin (NEURONTIN) 300 MG capsule Take 300 mg by mouth 3 (three) times daily.    Historical Provider, MD  HYDROmorphone (DILAUDID) 2 MG tablet Take 2 mg by mouth every 3 (three) hours as needed for moderate pain or severe pain.     Historical Provider, MD  lisinopril (PRINIVIL,ZESTRIL) 5 MG tablet Take 5 mg by mouth daily.    Historical Provider, MD  LORazepam (ATIVAN) 0.5 MG tablet Take 2 tablets (1 mg total) by mouth every 8 (eight) hours as needed for anxiety. Patient not taking: Reported on 07/06/2016 04/21/16   Milton Ferguson, MD  prochlorperazine (COMPAZINE) 10 MG tablet Take 10 mg by mouth every 6 (six) hours as needed for nausea or vomiting.    Historical Provider, MD  spironolactone (ALDACTONE) 25 MG tablet Take 25 mg by mouth daily.    Historical Provider, MD  sucralfate (CARAFATE) 1 g tablet Take 1 g by mouth 4 (four) times daily -  before meals and at bedtime.    Historical Provider, MD    Family History No family history on file.  Social History Social History  Substance Use Topics  . Smoking status: Never Smoker  . Smokeless tobacco: Never Used  . Alcohol use No     Allergies   Review of patient's allergies indicates no known allergies.   Review of Systems Review of Systems  Constitutional: Negative for appetite change and fever.  HENT: Negative for congestion.   Respiratory: Positive for shortness of breath.   Cardiovascular: Positive for chest pain.  Gastrointestinal: Positive for nausea. Negative for abdominal pain and vomiting.  Genitourinary: Negative  for dysuria.  Allergic/Immunologic: Positive for immunocompromised state.  Neurological: Negative for headaches.  Hematological: Does not bruise/bleed easily.  Psychiatric/Behavioral: Negative for confusion.     Physical Exam Updated Vital Signs BP 115/93 (BP Location: Right Arm)   Pulse 94   Temp 97.4 F (36.3 C) (Oral)   Resp 14   Ht 5\' 7"  (1.702 m)   Wt 59 kg   SpO2 100%   BMI 20.36 kg/m   Physical Exam  Constitutional: She is oriented to person, place, and time. She appears well-developed. No distress.  Patient very thin.  HENT:  Head: Normocephalic and atraumatic.  Mucous membranes slightly dry  Eyes: EOM are normal.  Pupils are equal, round, and reactive to light.  Neck: Normal range of motion. Neck supple.  Cardiovascular: Normal rate, regular rhythm and normal heart sounds.   Pulmonary/Chest: Effort normal and breath sounds normal. No respiratory distress. She has no wheezes. She has no rales.  Port right upper chest palpable through the skin.  Abdominal: Soft. Bowel sounds are normal. There is no tenderness.  Genitourinary:  Genitourinary Comments: Foley catheter in place.  Musculoskeletal: Normal range of motion.  Neurological: She is alert and oriented to person, place, and time. No cranial nerve deficit. She exhibits normal muscle tone. Coordination normal.  Skin: Skin is warm.  Nursing note and vitals reviewed.    ED Treatments / Results  Labs (all labs ordered are listed, but only abnormal results are displayed) Labs Reviewed  CBC WITH DIFFERENTIAL/PLATELET - Abnormal; Notable for the following:       Result Value   RBC 3.16 (*)    Hemoglobin 9.3 (*)    HCT 27.7 (*)    RDW 16.8 (*)    Neutro Abs 9.3 (*)    All other components within normal limits  BASIC METABOLIC PANEL - Abnormal; Notable for the following:    Sodium 127 (*)    Chloride 95 (*)    CO2 19 (*)    BUN 21 (*)    All other components within normal limits  I-STAT TROPOININ, ED  I-STAT TROPOININ, ED   Results for orders placed or performed during the hospital encounter of 07/06/16  CBC with Differential/Platelet  Result Value Ref Range   WBC 10.4 4.0 - 10.5 K/uL   RBC 3.16 (L) 3.87 - 5.11 MIL/uL   Hemoglobin 9.3 (L) 12.0 - 15.0 g/dL   HCT 27.7 (L) 36.0 - 46.0 %   MCV 87.7 78.0 - 100.0 fL   MCH 29.4 26.0 - 34.0 pg   MCHC 33.6 30.0 - 36.0 g/dL   RDW 16.8 (H) 11.5 - 15.5 %   Platelets 224 150 - 400 K/uL   Neutrophils Relative % 89 %   Neutro Abs 9.3 (H) 1.7 - 7.7 K/uL   Lymphocytes Relative 8 %   Lymphs Abs 0.8 0.7 - 4.0 K/uL   Monocytes Relative 3 %   Monocytes Absolute 0.3 0.1 - 1.0 K/uL   Eosinophils  Relative 0 %   Eosinophils Absolute 0.0 0.0 - 0.7 K/uL   Basophils Relative 0 %   Basophils Absolute 0.0 0.0 - 0.1 K/uL  Basic metabolic panel  Result Value Ref Range   Sodium 127 (L) 135 - 145 mmol/L   Potassium 4.1 3.5 - 5.1 mmol/L   Chloride 95 (L) 101 - 111 mmol/L   CO2 19 (L) 22 - 32 mmol/L   Glucose, Bld 69 65 - 99 mg/dL   BUN 21 (H) 6 - 20  mg/dL   Creatinine, Ser 0.91 0.44 - 1.00 mg/dL   Calcium 9.4 8.9 - 10.3 mg/dL   GFR calc non Af Amer >60 >60 mL/min   GFR calc Af Amer >60 >60 mL/min   Anion gap 13 5 - 15  I-stat troponin, ED  Result Value Ref Range   Troponin i, poc 0.03 0.00 - 0.08 ng/mL   Comment 3          I-stat troponin, ED  Result Value Ref Range   Troponin i, poc 0.01 0.00 - 0.08 ng/mL   Comment 3             EKG  EKG Interpretation  Date/Time:  Friday July 06 2016 14:43:33 EDT Ventricular Rate:  94 PR Interval:    QRS Duration: 87 QT Interval:  340 QTC Calculation: 426 R Axis:   62 Text Interpretation:  Sinus rhythm Borderline low voltage, extremity leads Confirmed by Rogene Houston  MD, Tonnie Stillman [phone]) on 07/06/2016 3:26:14 PM       Radiology Dg Chest Port 1 View  Result Date: 07/06/2016 CLINICAL DATA:  Chest pain EXAM: PORTABLE CHEST 1 VIEW COMPARISON:  06/17/2016 FINDINGS: Heart size is normal. Power port unchanged with tip in the SVC at the azygos level. The lungs are clear. Chronic elevation of the right hemidiaphragm. Vascularity is normal. Chronic degenerative changes affect shoulders. IMPRESSION: No active disease. Electronically Signed   By: Nelson Chimes M.D.   On: 07/06/2016 15:55    Procedures Procedures (including critical care time)  Medications Ordered in ED Medications  0.9 %  sodium chloride infusion ( Intravenous New Bag/Given 07/06/16 1551)     Initial Impression / Assessment and Plan / ED Course  I have reviewed the triage vital signs and the nursing notes.  Pertinent labs & imaging results that were available during my  care of the patient were reviewed by me and considered in my medical decision making (see chart for details).  Clinical Course    The patient in hospice care. Patient with metastatic colon cancer no longer receiving therapy. Patient with recent admission for blood transfusion. Patient brought in by EMS today for left shoulder pain and chest pain that started last night. Patient's chest x-ray without acute findings. Oxygen saturations are 100% on room air. Patient's troponins 2 were negative. Labs without significant abnormalities. Bit of anemia with a hemoglobin in the 9 range but better than when she required transfusion.  Patient will be discharged back to hospice care at home. Family members okay patient feels better. Patient urine was a bit concentrated. Patient did receive IV fluids while here which made her feel better.  Final Clinical Impressions(s) / ED Diagnoses   Final diagnoses:  Chest pain, unspecified type  Metastatic colon cancer in female [facility])    New Prescriptions New Prescriptions   No medications on file     Fredia Sorrow, MD 07/06/16 1907

## 2016-07-06 NOTE — Discharge Instructions (Signed)
Today's workup for the chest pain and jaw pain without any specific findings. No evidence of any acute heart attack. Recommend close follow-up with the hospice nurse.

## 2016-08-17 DEATH — deceased

## 2018-06-12 IMAGING — CR DG CHEST 1V PORT
1 series · 1 of 1 positions shown · non-contrast
Comparison: 06/17/2016

CLINICAL DATA: Chest pain

EXAM:
PORTABLE CHEST 1 VIEW

[portable]
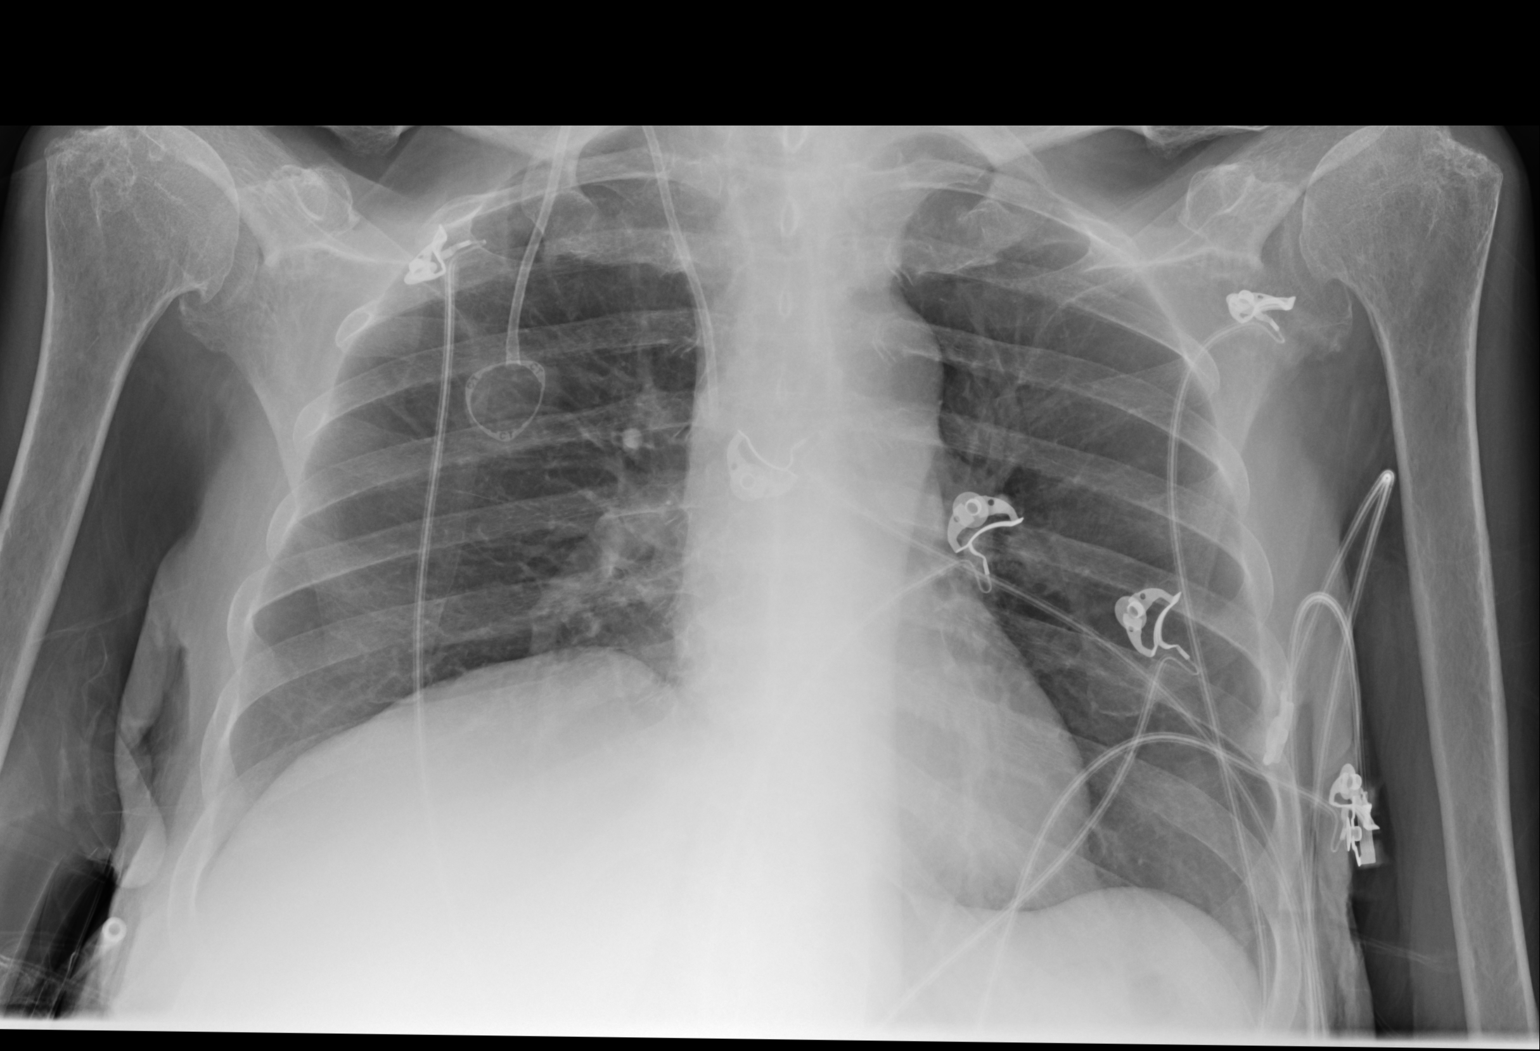

[1 of 1 positions shown; findings below may reference images not displayed]

FINDINGS: Heart size is normal. Power port unchanged with tip in the SVC at
the azygos level. The lungs are clear. Chronic elevation of the
right hemidiaphragm. Vascularity is normal. Chronic degenerative
changes affect shoulders.
IMPRESSION: No active disease.
